# Patient Record
Sex: Male | Born: 1955 | Race: White | Hispanic: No
Health system: Midwestern US, Community
[De-identification: ages and names within clinical notes are randomized; demographics above are authoritative.]

## PROBLEM LIST (undated history)

## (undated) DIAGNOSIS — I10 Essential (primary) hypertension: Secondary | ICD-10-CM

## (undated) DIAGNOSIS — K922 Gastrointestinal hemorrhage, unspecified: Secondary | ICD-10-CM

## (undated) DIAGNOSIS — S069XAA Unspecified intracranial injury with loss of consciousness status unknown, initial encounter: Secondary | ICD-10-CM

## (undated) DIAGNOSIS — F259 Schizoaffective disorder, unspecified: Secondary | ICD-10-CM

## (undated) DIAGNOSIS — I639 Cerebral infarction, unspecified: Secondary | ICD-10-CM

## (undated) DIAGNOSIS — J449 Chronic obstructive pulmonary disease, unspecified: Secondary | ICD-10-CM

## (undated) DIAGNOSIS — B1920 Unspecified viral hepatitis C without hepatic coma: Secondary | ICD-10-CM

## (undated) DIAGNOSIS — I219 Acute myocardial infarction, unspecified: Secondary | ICD-10-CM

## (undated) DIAGNOSIS — I255 Ischemic cardiomyopathy: Secondary | ICD-10-CM

## (undated) DIAGNOSIS — F1910 Other psychoactive substance abuse, uncomplicated: Secondary | ICD-10-CM

## (undated) DIAGNOSIS — F319 Bipolar disorder, unspecified: Secondary | ICD-10-CM

## (undated) DIAGNOSIS — F1010 Alcohol abuse, uncomplicated: Secondary | ICD-10-CM

## (undated) DIAGNOSIS — S069X9A Unspecified intracranial injury with loss of consciousness of unspecified duration, initial encounter: Secondary | ICD-10-CM

## (undated) HISTORY — PX: CRANIOTOMY: SHX93

## (undated) HISTORY — PX: SPLENECTOMY: SUR1306

## (undated) HISTORY — PX: CORONARY ANGIOPLASTY WITH STENT PLACEMENT: SHX49

---

## 2011-08-18 ENCOUNTER — Emergency Department: Payer: Self-pay | Admitting: Emergency Medicine

## 2011-08-18 LAB — COMPREHENSIVE METABOLIC PANEL
Alkaline Phosphatase: 97 U/L (ref 50–136)
Anion Gap: 8 (ref 7–16)
Calcium, Total: 9.1 mg/dL (ref 8.5–10.1)
Chloride: 110 mmol/L — ABNORMAL HIGH (ref 98–107)
Creatinine: 0.97 mg/dL (ref 0.60–1.30)
EGFR (African American): >60
Glucose: 124 mg/dL — ABNORMAL HIGH (ref 65–99)
Potassium: 4 mmol/L (ref 3.5–5.1)
Total Protein: 7.8 g/dL (ref 6.4–8.2)

## 2011-08-18 LAB — CBC
HCT: 39.9 % — ABNORMAL LOW (ref 40.0–52.0)
MCH: 29.7 pg (ref 26.0–34.0)
RDW: 16.1 % — ABNORMAL HIGH (ref 11.5–14.5)
WBC: 14.1 10*3/uL — ABNORMAL HIGH (ref 3.8–10.6)

## 2011-08-18 LAB — ACETAMINOPHEN LEVEL: Acetaminophen: <2 ug/mL

## 2011-08-18 LAB — DRUG SCREEN, URINE
Amphetamines, Ur Screen: NEGATIVE (ref ?–1000)
Barbiturates, Ur Screen: NEGATIVE (ref ?–200)
Benzodiazepine, Ur Scrn: NEGATIVE (ref ?–200)
MDMA (Ecstasy)Ur Screen: NEGATIVE (ref ?–500)
Opiate, Ur Screen: NEGATIVE (ref ?–300)

## 2011-08-18 LAB — ETHANOL
Ethanol %: <0.003 % (ref 0.000–0.080)
Ethanol: <3 mg/dL

## 2011-08-18 LAB — TSH: Thyroid Stimulating Horm: 0.25 u[IU]/mL — ABNORMAL LOW

## 2011-08-19 ENCOUNTER — Emergency Department: Payer: Self-pay | Admitting: Emergency Medicine

## 2012-03-26 ENCOUNTER — Encounter (HOSPITAL_COMMUNITY): Payer: Self-pay | Admitting: *Deleted

## 2012-03-26 ENCOUNTER — Emergency Department (HOSPITAL_COMMUNITY)
Admission: EM | Admit: 2012-03-26 | Discharge: 2012-03-26 | Disposition: A | Discharge status: Home / Self Care | Payer: Medicare Other | Attending: Emergency Medicine | Admitting: Emergency Medicine

## 2012-03-26 DIAGNOSIS — I252 Old myocardial infarction: Secondary | ICD-10-CM | POA: Insufficient documentation

## 2012-03-26 DIAGNOSIS — F17200 Nicotine dependence, unspecified, uncomplicated: Secondary | ICD-10-CM | POA: Insufficient documentation

## 2012-03-26 DIAGNOSIS — M25519 Pain in unspecified shoulder: Secondary | ICD-10-CM | POA: Insufficient documentation

## 2012-03-26 DIAGNOSIS — G8911 Acute pain due to trauma: Secondary | ICD-10-CM | POA: Insufficient documentation

## 2012-03-26 HISTORY — DX: Acute myocardial infarction, unspecified: I21.9

## 2012-03-26 MED ORDER — NAPROXEN 500 MG PO TABS: 500.0000 mg | ORAL_TABLET | Freq: Two times a day (BID) | ORAL | Status: DC

## 2012-03-26 MED ORDER — HYDROCODONE-ACETAMINOPHEN 10-325 MG PO TABS: 1.0000 | ORAL_TABLET | Freq: Four times a day (QID) | ORAL | Status: DC | PRN

## 2012-03-26 MED ORDER — HYDROCODONE-ACETAMINOPHEN 5-325 MG PO TABS
2.0000 | ORAL_TABLET | Freq: Once | ORAL | Status: AC
  Administered 2012-03-26: 2 via ORAL
  Filled 2012-03-26: qty 2

## 2012-03-26 NOTE — ED Notes (Signed)
Pt states he tried to catch a bail of hay 4 weeks ago, injuring left shoulder.

## 2012-03-26 NOTE — ED Provider Notes (Signed)
History  This chart was scribed for Benny Lennert, MD by Erskine Emery, ED Scribe. This patient was seen in room APA09/APA09 and the patient's care was started at 15:09.   CSN: [id number]  Arrival date & time 03/26/12  1449   First MD Initiated Contact with Patient 03/26/12 [phone]      Chief Complaint  Patient presents with  . Shoulder Injury    (Consider location/radiation/quality/duration/timing/severity/associated sxs/prior Treatment) Tyrone [name] is a 57 y.o. male who presents to the Emergency Department complaining of constant left shoulder pain that radiates down the left arm since he dislocated it trying to catch a heavy bail of hay falling off a truck 4 weeks ago. Pt was seen at a hospital in [place] after the incident where they x-rayed the arm and determined that it was dislocated but not fractured. They gave him 1 dose of Loretab, which helped relieve his pain, and told him to set up an appointment with an orthopedic doctor. Pt has an appointment with an orthopedist at the [facility] hospital in [place], but not for another 3 weeks. He has been taking only Tylenol and Motrin which do nothing to relieve his pain.  Patient is a 57 y.o. male presenting with shoulder injury. The history is provided by the patient. No language interpreter was used.  Shoulder Injury This is a new problem. The current episode started more than 1 week ago. The problem occurs constantly. The problem has not changed since onset.Pertinent negatives include no chest pain, no abdominal pain, no headaches and no shortness of breath. Exacerbated by: extension. Nothing relieves the symptoms. He has tried acetaminophen for the symptoms. The treatment provided no relief.    Past Medical History  Diagnosis Date  . Myocardial infarction     2008    Past Surgical History  Procedure Date  . Splenectomy   . Coronary angioplasty with stent placement     No family history on file.  History  Substance Use Topics    . Smoking status: Current Every Day Smoker  . Smokeless tobacco: Not on file  . Alcohol Use: No      Review of Systems  Respiratory: Negative for shortness of breath.   Cardiovascular: Negative for chest pain.  Gastrointestinal: Negative for abdominal pain.  Musculoskeletal:       Left shoulder pain  Neurological: Negative for headaches.  All other systems reviewed and are negative.    Allergies  Nitroglycerin; Toradol; and Ultram  Home Medications  No current outpatient prescriptions on file.  Triage Vitals: BP 146/97  Pulse 104  Temp 98.3 F (36.8 C) (Oral)  Resp 20  Ht 5\' 10"  (1.778 m)  Wt 200 lb (90.719 kg)  BMI 28.70 kg/m2  SpO2 97%  Physical Exam  Nursing note and vitals reviewed. Constitutional: He is oriented to person, place, and time. He appears well-developed.  HENT:  Head: Normocephalic.  Eyes: Conjunctivae normal are normal.  Neck: No tracheal deviation present.  Cardiovascular:  No murmur heard. Musculoskeletal: Normal range of motion. He exhibits tenderness.       Tender to palpation of the left anterior shoulder. Pain with extension.  Neurological: He is oriented to person, place, and time.  Skin: Skin is warm.  Psychiatric: He has a normal mood and affect.    ED Course  Procedures (including critical care time) DIAGNOSTIC STUDIES: Oxygen Saturation is 97% on room air, adequate by my interpretation.    COORDINATION OF CARE: 15:14--I evaluated the patient and we discussed  a treatment plan including pain and antiinflammatory medications to which the pt agreed.    Labs Reviewed - No data to display No results found.   No diagnosis found.    MDM      The chart was scribed for me under my direct supervision.  I personally performed the history, physical, and medical decision making and all procedures in the evaluation of this patient.Benny Lennert, MD 03/26/12 1524

## 2012-03-31 ENCOUNTER — Encounter (HOSPITAL_COMMUNITY): Payer: Self-pay | Admitting: *Deleted

## 2012-03-31 ENCOUNTER — Emergency Department (HOSPITAL_COMMUNITY)
Admission: EM | Admit: 2012-03-31 | Discharge: 2012-03-31 | Disposition: A | Discharge status: Home / Self Care | Payer: Medicare Other | Attending: Emergency Medicine | Admitting: Emergency Medicine

## 2012-03-31 DIAGNOSIS — F17200 Nicotine dependence, unspecified, uncomplicated: Secondary | ICD-10-CM | POA: Insufficient documentation

## 2012-03-31 DIAGNOSIS — M25519 Pain in unspecified shoulder: Secondary | ICD-10-CM | POA: Insufficient documentation

## 2012-03-31 DIAGNOSIS — I252 Old myocardial infarction: Secondary | ICD-10-CM | POA: Insufficient documentation

## 2012-03-31 DIAGNOSIS — M25512 Pain in left shoulder: Secondary | ICD-10-CM

## 2012-03-31 MED ORDER — OXYCODONE-ACETAMINOPHEN 5-325 MG PO TABS
2.0000 | ORAL_TABLET | Freq: Once | ORAL | Status: AC
  Administered 2012-03-31: 2 via ORAL
  Filled 2012-03-31: qty 2

## 2012-03-31 MED ORDER — NAPROXEN 500 MG PO TABS: 500.0000 mg | ORAL_TABLET | Freq: Two times a day (BID) | ORAL | Status: DC

## 2012-03-31 NOTE — ED Notes (Signed)
Pt states left shoulder injury x 5 weeks ago. Seen here previously for the same. States he recently moved and is unable to find pain med from last visit.

## 2012-03-31 NOTE — ED Provider Notes (Signed)
History     CSN: [id number]  Arrival date & time 03/31/12  1025   First MD Initiated Contact with Patient 03/31/12 1044      Chief Complaint  Patient presents with  . Shoulder Pain    (Consider location/radiation/quality/duration/timing/severity/associated sxs/prior treatment) HPI Comments: Patient with hx of chronic pain to the left shoulder for more than one month, states he has "torn the muscles and rotator cuff" to his left shoulder several weeks ago when trying to catch a falling log.  States he had X rays performed at [facility] at the time of the accident.  States that he has appt in 3 weeks with an orthopedist at the [place] in [place].  He was sen here earlier this week and given a prescription for #60 10 mg- Hydrocodone tablets which he states he lost during his recent move from [place] to [place].  He denies chest pain, shortness of breath, neck pain, numbness or weakness of the upper extremities  Patient is a 57 y.o. male presenting with shoulder pain. The history is provided by the patient.  Shoulder Pain This is a chronic problem. The current episode started more than 1 month ago. The problem occurs constantly. The problem has been unchanged. Associated symptoms include arthralgias. Pertinent negatives include no chest pain, chills, congestion, coughing, diaphoresis, fever, headaches, joint swelling, neck pain, numbness, rash, vertigo, vomiting or weakness. The symptoms are aggravated by bending (movement and palpation). He has tried NSAIDs, heat, ice, oral narcotics and position changes for the symptoms. The treatment provided mild relief.    Past Medical History  Diagnosis Date  . Myocardial infarction     2008    Past Surgical History  Procedure Date  . Splenectomy   . Coronary angioplasty with stent placement     No family history on file.  History  Substance Use Topics  . Smoking status: Current Every Day Smoker  . Smokeless tobacco: Not on file  .  Alcohol Use: No      Review of Systems  Constitutional: Negative for fever, chills and diaphoresis.  HENT: Negative for congestion and neck pain.   Respiratory: Negative for cough and chest tightness.   Cardiovascular: Negative for chest pain.  Gastrointestinal: Negative for vomiting.  Genitourinary: Negative for dysuria, flank pain and difficulty urinating.  Musculoskeletal: Positive for arthralgias. Negative for back pain and joint swelling.  Skin: Negative for color change, rash and wound.  Neurological: Negative for dizziness, vertigo, weakness, numbness and headaches.  All other systems reviewed and are negative.    Allergies  Nitroglycerin; Toradol; and Ultram  Home Medications   Current Outpatient Rx  Name  Route  Sig  Dispense  Refill  . HYDROCODONE-ACETAMINOPHEN 10-325 MG PO TABS   Oral   Take 1 tablet by mouth every 6 (six) hours as needed for pain.   60 tablet   0   . NAPROXEN 500 MG PO TABS   Oral   Take 1 tablet (500 mg total) by mouth 2 (two) times daily.   60 tablet   0     BP 195/94  Pulse 73  Temp 97.8 F (36.6 C) (Oral)  Resp 16  SpO2 99%  Physical Exam  Nursing note and vitals reviewed. Constitutional: He is oriented to person, place, and time. He appears well-developed and well-nourished. No distress.  HENT:  Head: Normocephalic and atraumatic.  Neck: Normal range of motion, full passive range of motion without pain and phonation normal. Neck supple. No spinous  process tenderness and no muscular tenderness present. No Brudzinski's sign and no Kernig's sign noted.  Cardiovascular: Normal rate, normal heart sounds and intact distal pulses.   No murmur heard. Pulmonary/Chest: Effort normal and breath sounds normal. No respiratory distress.  Musculoskeletal: He exhibits tenderness. He exhibits no edema.       Left shoulder: He exhibits tenderness and pain. He exhibits normal range of motion, no bony tenderness, no swelling, no effusion, no  crepitus, no deformity, no laceration, no spasm, normal pulse and normal strength.       Arms:      Diffuse ttp of the left anterior shoulder and scapula.  Pain to the shoulder is reproduced with abduction of the left arm.  No bony deformities, edema or discoloration.  Distal sensation intact, CR< 2 sec, radial pulse is brisk.    Lymphadenopathy:    He has no cervical adenopathy.  Neurological: He is alert and oriented to person, place, and time. He exhibits normal muscle tone. Coordination normal.  Skin: Skin is warm and dry.    ED Course  Procedures (including critical care time)  Labs Reviewed - No data to display No results found.     MDM    Previous ED chart reviewed.  Patient also reviewed on  narcotic database.  Had rx for #60 hydrocodone 10/325 mg filled on Monday 03/26/12.  I was also advised by nursing staff that patient also frequents [facility] hospital requesting narcotics for shoulder pain.   I have advised pt that problem is chronic and he will need further management with his orthopedist at [facility] in [place], [place].  No Focal neuro deficits on exam. Pain reproduced with abduction of the left arm.  No step-offs or bony deformities noted on exam.   Prescribed: naprosyn   Rorey Bisson L. [place], [place] 03/31/12 1713

## 2012-03-31 NOTE — ED Notes (Signed)
Pt upset and complaining about receiving naprosyn prescription instead of "percocet or vicoden or norco". Pt refuses to sign d/c. Paperwork and prescription left in room.

## 2012-04-01 NOTE — ED Provider Notes (Signed)
Medical screening examination/treatment/procedure(s) were performed by non-physician practitioner and as supervising physician I was immediately available for consultation/collaboration.   Joya Gaskins, MD 04/01/12 [phone]

## 2013-04-25 ENCOUNTER — Encounter (HOSPITAL_COMMUNITY): Payer: Self-pay

## 2013-04-25 ENCOUNTER — Inpatient Hospital Stay (HOSPITAL_COMMUNITY)
Admission: EM | Admit: 2013-04-25 | Discharge: 2013-05-02 | Disposition: A | Discharge status: Home / Self Care | Payer: Medicare Other | Source: Home / Self Care | Attending: Critical Care Medicine | Admitting: Critical Care Medicine

## 2013-04-25 ENCOUNTER — Inpatient Hospital Stay (HOSPITAL_COMMUNITY)
Admission: AD | Admit: 2013-04-25 | Discharge: 2013-04-25 | DRG: 356 | Discharge status: Against Medical Advice | Payer: Medicare Other | Source: Other Acute Inpatient Hospital | Attending: Internal Medicine | Admitting: Internal Medicine

## 2013-04-25 DIAGNOSIS — K3182 Dieulafoy lesion (hemorrhagic) of stomach and duodenum: Principal | ICD-10-CM | POA: Diagnosis present

## 2013-04-25 DIAGNOSIS — S069XAA Unspecified intracranial injury with loss of consciousness status unknown, initial encounter: Secondary | ICD-10-CM

## 2013-04-25 DIAGNOSIS — I25810 Atherosclerosis of coronary artery bypass graft(s) without angina pectoris: Secondary | ICD-10-CM

## 2013-04-25 DIAGNOSIS — I2510 Atherosclerotic heart disease of native coronary artery without angina pectoris: Secondary | ICD-10-CM | POA: Diagnosis present

## 2013-04-25 DIAGNOSIS — D62 Acute posthemorrhagic anemia: Secondary | ICD-10-CM | POA: Diagnosis not present

## 2013-04-25 DIAGNOSIS — K922 Gastrointestinal hemorrhage, unspecified: Secondary | ICD-10-CM | POA: Diagnosis present

## 2013-04-25 DIAGNOSIS — F17200 Nicotine dependence, unspecified, uncomplicated: Secondary | ICD-10-CM | POA: Diagnosis present

## 2013-04-25 DIAGNOSIS — S069X9A Unspecified intracranial injury with loss of consciousness of unspecified duration, initial encounter: Secondary | ICD-10-CM

## 2013-04-25 DIAGNOSIS — D500 Iron deficiency anemia secondary to blood loss (chronic): Secondary | ICD-10-CM | POA: Diagnosis present

## 2013-04-25 DIAGNOSIS — E876 Hypokalemia: Secondary | ICD-10-CM | POA: Diagnosis not present

## 2013-04-25 DIAGNOSIS — J9600 Acute respiratory failure, unspecified whether with hypoxia or hypercapnia: Secondary | ICD-10-CM | POA: Diagnosis not present

## 2013-04-25 DIAGNOSIS — F1920 Other psychoactive substance dependence, uncomplicated: Secondary | ICD-10-CM | POA: Diagnosis present

## 2013-04-25 DIAGNOSIS — I252 Old myocardial infarction: Secondary | ICD-10-CM

## 2013-04-25 DIAGNOSIS — F319 Bipolar disorder, unspecified: Secondary | ICD-10-CM

## 2013-04-25 DIAGNOSIS — G9340 Encephalopathy, unspecified: Secondary | ICD-10-CM | POA: Diagnosis not present

## 2013-04-25 DIAGNOSIS — R578 Other shock: Secondary | ICD-10-CM | POA: Diagnosis present

## 2013-04-25 DIAGNOSIS — Z9089 Acquired absence of other organs: Secondary | ICD-10-CM

## 2013-04-25 DIAGNOSIS — Z9861 Coronary angioplasty status: Secondary | ICD-10-CM

## 2013-04-25 DIAGNOSIS — N179 Acute kidney failure, unspecified: Secondary | ICD-10-CM | POA: Diagnosis not present

## 2013-04-25 DIAGNOSIS — F19939 Other psychoactive substance use, unspecified with withdrawal, unspecified: Secondary | ICD-10-CM | POA: Diagnosis not present

## 2013-04-25 DIAGNOSIS — F1010 Alcohol abuse, uncomplicated: Secondary | ICD-10-CM | POA: Diagnosis present

## 2013-04-25 DIAGNOSIS — E875 Hyperkalemia: Secondary | ICD-10-CM | POA: Diagnosis not present

## 2013-04-25 DIAGNOSIS — F411 Generalized anxiety disorder: Secondary | ICD-10-CM

## 2013-04-25 DIAGNOSIS — J449 Chronic obstructive pulmonary disease, unspecified: Secondary | ICD-10-CM | POA: Diagnosis present

## 2013-04-25 DIAGNOSIS — J441 Chronic obstructive pulmonary disease with (acute) exacerbation: Secondary | ICD-10-CM

## 2013-04-25 DIAGNOSIS — J4489 Other specified chronic obstructive pulmonary disease: Secondary | ICD-10-CM | POA: Diagnosis present

## 2013-04-25 LAB — COMPREHENSIVE METABOLIC PANEL
ALBUMIN: 2.6 g/dL — AB (ref 3.5–5.2)
ALK PHOS: 54 U/L (ref 39–117)
ALT: 21 U/L (ref 0–53)
AST: 27 U/L (ref 0–37)
BILIRUBIN TOTAL: 0.4 mg/dL (ref 0.3–1.2)
BUN: 38 mg/dL — ABNORMAL HIGH (ref 6–23)
CHLORIDE: 108 meq/L (ref 96–112)
CO2: 22 mEq/L (ref 19–32)
Calcium: 8.5 mg/dL (ref 8.4–10.5)
Creatinine, Ser: 0.69 mg/dL (ref 0.50–1.35)
GFR calc Af Amer: >90 mL/min (ref >90)
GFR calc non Af Amer: >90 mL/min (ref >90)
Glucose, Bld: 109 mg/dL — ABNORMAL HIGH (ref 70–99)
Potassium: 5 mEq/L (ref 3.7–5.3)
Sodium: 140 mEq/L (ref 137–147)
Total Protein: 5.1 g/dL — ABNORMAL LOW (ref 6.0–8.3)

## 2013-04-25 LAB — CBC
HCT: 26.4 % — ABNORMAL LOW (ref 39.0–52.0)
HCT: 22.2 % — ABNORMAL LOW (ref 39.0–52.0)
HEMATOCRIT: 25.7 % — AB (ref 39.0–52.0)
HEMATOCRIT: 22.6 % — AB (ref 39.0–52.0)
HEMOGLOBIN: 9 g/dL — AB (ref 13.0–17.0)
HEMOGLOBIN: 9.1 g/dL — AB (ref 13.0–17.0)
HEMOGLOBIN: 7.8 g/dL — AB (ref 13.0–17.0)
HEMOGLOBIN: 7.9 g/dL — AB (ref 13.0–17.0)
MCH: 33.3 pg (ref 26.0–34.0)
MCH: 32.6 pg (ref 26.0–34.0)
MCH: 33.1 pg (ref 26.0–34.0)
MCH: 33.9 pg (ref 26.0–34.0)
MCHC: 35 g/dL (ref 30.0–36.0)
MCHC: 34.5 g/dL (ref 30.0–36.0)
MCHC: 34.5 g/dL (ref 30.0–36.0)
MCHC: 35.6 g/dL (ref 30.0–36.0)
MCV: 95.2 fL (ref 78.0–100.0)
MCV: 94.6 fL (ref 78.0–100.0)
MCV: 95.3 fL (ref 78.0–100.0)
MCV: 95.8 fL (ref 78.0–100.0)
Platelets: 140 10*3/uL — ABNORMAL LOW (ref 150–400)
Platelets: 146 10*3/uL — ABNORMAL LOW (ref 150–400)
Platelets: 140 10*3/uL — ABNORMAL LOW (ref 150–400)
Platelets: 147 10*3/uL — ABNORMAL LOW (ref 150–400)
RBC: 2.7 MIL/uL — ABNORMAL LOW (ref 4.22–5.81)
RBC: 2.79 MIL/uL — ABNORMAL LOW (ref 4.22–5.81)
RBC: 2.33 MIL/uL — ABNORMAL LOW (ref 4.22–5.81)
RBC: 2.36 MIL/uL — AB (ref 4.22–5.81)
RDW: 14.2 % (ref 11.5–15.5)
RDW: 14.2 % (ref 11.5–15.5)
RDW: 14.4 % (ref 11.5–15.5)
RDW: 14.7 % (ref 11.5–15.5)
WBC: 12.3 10*3/uL — AB (ref 4.0–10.5)
WBC: 12.7 10*3/uL — AB (ref 4.0–10.5)
WBC: 17.8 10*3/uL — ABNORMAL HIGH (ref 4.0–10.5)
WBC: 18.2 10*3/uL — AB (ref 4.0–10.5)

## 2013-04-25 LAB — URINALYSIS, ROUTINE W REFLEX MICROSCOPIC
Bilirubin Urine: NEGATIVE
GLUCOSE, UA: NEGATIVE mg/dL
Hgb urine dipstick: NEGATIVE
KETONES UR: 15 mg/dL — AB
LEUKOCYTES UA: NEGATIVE
Nitrite: NEGATIVE
Protein, ur: NEGATIVE mg/dL
Specific Gravity, Urine: 1.023 (ref 1.005–1.030)
UROBILINOGEN UA: 1 mg/dL (ref 0.0–1.0)
pH: 7 (ref 5.0–8.0)

## 2013-04-25 LAB — PREPARE RBC (CROSSMATCH)

## 2013-04-25 LAB — MAGNESIUM: Magnesium: 1.9 mg/dL (ref 1.5–2.5)

## 2013-04-25 LAB — POCT I-STAT, CHEM 8
BUN: 39 mg/dL — AB (ref 6–23)
CREATININE: 1 mg/dL (ref 0.50–1.35)
Calcium, Ion: 1.28 mmol/L — ABNORMAL HIGH (ref 1.12–1.23)
Chloride: 106 mEq/L (ref 96–112)
Glucose, Bld: 147 mg/dL — ABNORMAL HIGH (ref 70–99)
HCT: 23 % — ABNORMAL LOW (ref 39.0–52.0)
Hemoglobin: 7.8 g/dL — ABNORMAL LOW (ref 13.0–17.0)
Potassium: 3.6 mEq/L — ABNORMAL LOW (ref 3.7–5.3)
SODIUM: 142 meq/L (ref 137–147)
TCO2: 20 mmol/L (ref 0–100)

## 2013-04-25 LAB — APTT
APTT: 34 s (ref 24–37)
aPTT: 34 seconds (ref 24–37)
aPTT: 30 seconds (ref 24–37)

## 2013-04-25 LAB — PROTIME-INR
INR: 1.13 (ref 0.00–1.49)
INR: 1.15 (ref 0.00–1.49)
INR: 1.09 (ref 0.00–1.49)
PROTHROMBIN TIME: 14.3 s (ref 11.6–15.2)
PROTHROMBIN TIME: 13.9 s (ref 11.6–15.2)
Prothrombin Time: 14.5 seconds (ref 11.6–15.2)

## 2013-04-25 LAB — MRSA PCR SCREENING: MRSA BY PCR: NEGATIVE

## 2013-04-25 LAB — PHOSPHORUS: Phosphorus: 2.4 mg/dL (ref 2.3–4.6)

## 2013-04-25 LAB — PROCALCITONIN

## 2013-04-25 LAB — CORTISOL: Cortisol, Plasma: 14.7 ug/dL

## 2013-04-25 LAB — TROPONIN I: Troponin I: <0.3 ng/mL (ref <0.30)

## 2013-04-25 LAB — ABO/RH: ABO/RH(D): O POS

## 2013-04-25 LAB — LACTIC ACID, PLASMA: Lactic Acid, Venous: 1.1 mmol/L (ref 0.5–2.2)

## 2013-04-25 MED ORDER — ONDANSETRON HCL 4 MG/2ML IJ SOLN
4.0000 mg | Freq: Four times a day (QID) | INTRAMUSCULAR | Status: DC | PRN
  Administered 2013-04-25 – 2013-04-28 (×2): 4 mg via INTRAVENOUS
  Filled 2013-04-25 (×2): qty 2

## 2013-04-25 MED ORDER — SODIUM CHLORIDE 0.9 % IV SOLN
8.0000 mg/h | INTRAVENOUS | Status: DC
  Administered 2013-04-25 – 2013-04-27 (×4): 8 mg/h via INTRAVENOUS
  Filled 2013-04-25 (×10): qty 80

## 2013-04-25 MED ORDER — SODIUM CHLORIDE 0.9 % IV SOLN
25.0000 ug/h | INTRAVENOUS | Status: DC
  Administered 2013-04-25: 30 ug/h via INTRAVENOUS
  Administered 2013-04-26: 25 ug/h via INTRAVENOUS
  Filled 2013-04-25 (×8): qty 1

## 2013-04-25 MED ORDER — FENTANYL CITRATE 0.05 MG/ML IJ SOLN: 25.0000 ug | INTRAMUSCULAR | Status: DC | PRN

## 2013-04-25 MED ORDER — SODIUM CHLORIDE 0.9 % IV SOLN
8.0000 mg/h | INTRAVENOUS | Status: DC
  Administered 2013-04-25: 8 mg/h via INTRAVENOUS
  Filled 2013-04-25 (×3): qty 80

## 2013-04-25 MED ORDER — SODIUM CHLORIDE 0.9 % IV SOLN
25.0000 ug/h | INTRAVENOUS | Status: DC
  Administered 2013-04-25: 25 ug/h via INTRAVENOUS
  Filled 2013-04-25 (×3): qty 1

## 2013-04-25 MED ORDER — SODIUM CHLORIDE 0.9 % IV SOLN: INTRAVENOUS | Status: DC

## 2013-04-25 MED ORDER — ONDANSETRON HCL 4 MG/2ML IJ SOLN
INTRAMUSCULAR | Status: AC
Start: 2013-04-25 — End: 2013-04-25
  Administered 2013-04-25: 4 mg via INTRAVENOUS
  Filled 2013-04-25: qty 2

## 2013-04-25 MED ORDER — OCTREOTIDE LOAD VIA INFUSION
50.0000 ug | Freq: Once | INTRAVENOUS | Status: AC
  Filled 2013-04-25: qty 25

## 2013-04-25 MED ORDER — SODIUM CHLORIDE 0.9 % IV SOLN
80.0000 mg | Freq: Once | INTRAVENOUS | Status: AC
  Administered 2013-04-25: 80 mg via INTRAVENOUS
  Filled 2013-04-25: qty 80

## 2013-04-25 MED ORDER — PANTOPRAZOLE SODIUM 40 MG IV SOLR
40.0000 mg | Freq: Two times a day (BID) | INTRAVENOUS | Status: DC
Start: 2013-04-28 — End: 2013-04-25

## 2013-04-25 MED ORDER — SODIUM CHLORIDE 0.9 % IV SOLN
80.0000 mg | Freq: Once | INTRAVENOUS | Status: DC
  Filled 2013-04-25: qty 80

## 2013-04-25 MED ORDER — FENTANYL CITRATE 0.05 MG/ML IJ SOLN
INTRAMUSCULAR | Status: AC
Start: 2013-04-25 — End: 2013-04-26
  Filled 2013-04-25: qty 2

## 2013-04-25 MED ORDER — SODIUM CHLORIDE 0.9 % IV SOLN
INTRAVENOUS | Status: DC
  Administered 2013-04-25 – 2013-04-26 (×3): via INTRAVENOUS

## 2013-04-25 MED ORDER — NICOTINE 21 MG/24HR TD PT24
21.0000 mg | MEDICATED_PATCH | Freq: Every day | TRANSDERMAL | Status: DC
  Administered 2013-04-25: 21 mg via TRANSDERMAL
  Filled 2013-04-25: qty 1

## 2013-04-25 MED ORDER — FENTANYL CITRATE 0.05 MG/ML IJ SOLN
50.0000 ug | Freq: Once | INTRAMUSCULAR | Status: AC
Start: 2013-04-25 — End: 2013-04-25
  Administered 2013-04-25: 50 ug via INTRAVENOUS

## 2013-04-25 MED ORDER — METOCLOPRAMIDE HCL 5 MG/ML IJ SOLN
5.0000 mg | Freq: Three times a day (TID) | INTRAMUSCULAR | Status: DC | PRN
  Administered 2013-04-25: 5 mg via INTRAVENOUS
  Filled 2013-04-25: qty 1
  Filled 2013-04-25: qty 2

## 2013-04-25 MED ORDER — LORAZEPAM 2 MG/ML IJ SOLN
0.2500 mg | INTRAMUSCULAR | Status: DC | PRN
  Administered 2013-04-25: 0.5 mg via INTRAVENOUS
  Filled 2013-04-25: qty 1

## 2013-04-25 MED ORDER — FENTANYL CITRATE 0.05 MG/ML IJ SOLN
25.0000 ug | INTRAMUSCULAR | Status: DC | PRN
  Administered 2013-04-25 – 2013-04-26 (×2): 50 ug via INTRAVENOUS
  Filled 2013-04-25 (×2): qty 2

## 2013-04-25 MED ORDER — ONDANSETRON HCL 4 MG/2ML IJ SOLN
4.0000 mg | Freq: Once | INTRAMUSCULAR | Status: AC
  Administered 2013-04-25: 4 mg via INTRAVENOUS

## 2013-04-25 NOTE — H&P (Signed)
Name: Tyrone [name] MRN: [id number] DOB: 06-Aug-1955    ADMISSION DATE:  04/25/2013    PRIMARY SERVICE: PCCM   CHIEF COMPLAINT:  GIB  BRIEF PATIENT DESCRIPTION:  58 yo WM transferred from [facility] on 2/12 for UGIB , transient hypotension. He was seen 6 days prior at [facility]. for similar issue but signed out AMA. He was admitted to [facility] service, placed on PPI drip, Octreotide Drip and GI called. Per pt he had: EGD was told (reliability?) for gastric avm? Later in the afternoon on 2/12 he became more anxious and agitated. He left AMA at 1821. He returned within an hour to the ER having passed large volume dk bloody stool and vomiting large volume of blood. PCCM re-admitted.   SIGNIFICANT EVENTS / STUDIES:  2/12: left ama, re-admitted a hour after.   LINES / TUBES: PIV  CULTURES: none  ANTIBIOTICS: none  HISTORY OF PRESENT ILLNESS:   58 yo WM transferred from [facility] on 2/12 for UGIB , transient hypotension. He was seen 6 days prior at [facility]. for similar issue but signed out AMA. He presented to [facility] with chief complaint of right calf pain and had negative LEDS and was being discharged when he vomited blood and passed out. [place] [facility] were on diversion therefore he was transferred to [place], He was placed on PPI drip, Octreotide Drip and GI called. 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 EGD was told (reliability?) for gastric avm? Later in the afternoon on 2/12 he became more anxious and agitated. He left AMA at 1821. He returned within an hour to the ER having passed large volume dk bloody stool and vomiting large volume of blood. PCCM re-admitted.  PAST MEDICAL HISTORY :  Past Medical History  Diagnosis Date  . Myocardial infarction     2008   Past Surgical History  Procedure Laterality Date  . Splenectomy    . Coronary angioplasty with stent placement     Prior to Admission medications   Medication Sig Start Date End Date  Taking? Authorizing Provider  ALPRAZolam Prudy Feeler) 1 MG tablet Take 1 mg by mouth 3 (three) times daily.    Yes Historical Provider, MD  HYDROcodone-acetaminophen (NORCO) 10-325 MG per tablet Take 1-2 tablets by mouth every 6 (six) hours as needed for moderate pain.   Yes Historical Provider, MD   Allergies  Allergen Reactions  . Nitroglycerin Nausea And Vomiting  . Toradol [Ketorolac Tromethamine] Itching  . Ultram [Tramadol] Itching    FAMILY HISTORY:  No family history on file. SOCIAL HISTORY:  reports that he has been smoking Cigarettes.  He has a 40 pack-year smoking history. He does not have any smokeless tobacco history on file. He reports that he does not drink alcohol or use illicit drugs.  REVIEW OF SYSTEMS:   Review of Systems:   Bolds are positive  Constitutional: weight loss, gain, night sweats, Fevers, chills, fatigue .  HEENT: headaches, Sore throat, sneezing, nasal congestion, post nasal drip, Difficulty swallowing, Tooth/dental problems, visual complaints visual changes, ear ache CV:  chest pain, radiates: ,Orthopnea, PND, swelling in lower extremities, dizziness, palpitations, syncope.  GI  heartburn, indigestion, abdominal pain, nausea, vomiting, diarrhea, change in bowel habits, loss of appetite, bloody stools.  Resp: cough, productive: , hemoptysis, dyspnea, chest pain, pleuritic.  Skin: rash or itching or icterus GU: dysuria, change in color of urine, urgency or frequency. flank pain, hematuria  MS: joint pain or swelling. decreased range of  motion  Psych: change in mood or affect. depression or anxiety.  Neuro: difficulty with speech, weakness, numbness, ataxia   SUBJECTIVE:  Feels better  VITAL SIGNS: Temp:  [97.9 F (36.6 C)-99.4 F (37.4 C)] 98.3 F (36.8 C) (02/12 2212) Pulse Rate:  [76-108] 87 (02/12 2215) Resp:  [12-30] 18 (02/12 2215) BP: (77-141)/(45-108) 101/71 mmHg (02/12 2215) SpO2:  [92 %-100 %] 100 % (02/12 2215) Weight:  [68.04 kg (150 lb)]  68.04 kg (150 lb) (02/12 0853) HEMODYNAMICS: BP 70 initially on admit, now >100      INTAKE / OUTPUT: Intake/Output     02/12 0701 - 02/13 0700   I.V. 2000   Blood 301.4   Total Intake 2301.4   Net +2301.4       Emesis Occurrence 1 x     PHYSICAL EXAMINATION: General:  Not in acute distress.  Neuro:  [place], no focal def  HEENT:  [place], edentulous  Cardiovascular:  rrr Lungs:  Clear  Abdomen:  Tender to palp  Musculoskeletal:  Intact  Skin:  Dry, intact   LABS:  CBC  Recent Labs Lab 04/25/13 1040 04/25/13 1306 04/25/13 2050 04/25/13 2105  WBC 12.3* 12.7* 17.8*  --   HGB 9.0* 9.1* 7.9* 7.8*  HCT 25.7* 26.4* 22.2* 23.0*  PLT 140* 146* 140*  --    Coag's  Recent Labs Lab 04/25/13 1040 04/25/13 1242  APTT 34 34  INR 1.13 1.15   BMET  Recent Labs Lab 04/25/13 1040 04/25/13 2105  NA 140 142  K 5.0 3.6*  CL 108 106  CO2 22  --   BUN 38* 39*  CREATININE 0.69 1.00  GLUCOSE 109* 147*   Electrolytes  Recent Labs Lab 04/25/13 1040  CALCIUM 8.5  MG 1.9  PHOS 2.4   Sepsis Markers  Recent Labs Lab 04/25/13 1040  LATICACIDVEN 1.1  PROCALCITON <0.10   ABG No results found for this basename: PHART, PCO2ART, PO2ART,  in the last 168 hours Liver Enzymes  Recent Labs Lab 04/25/13 1040  AST 27  ALT 21  ALKPHOS 54  BILITOT 0.4  ALBUMIN 2.6*   Cardiac Enzymes  Recent Labs Lab 04/25/13 1040  TROPONINI <0.30   Glucose No results found for this basename: GLUCAP,  in the last 168 hours  Imaging No results found.   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 . Think this prob was reason he left AMA  P:  Takes xanax at home, regularly..will give him low dose lorazepam  TODAY'S SUMMARY:  Acute UGIB. Report of gastric AVM. Left AMA but back w/in an hour after vomiting  bright red blood, and having lg volume dk stool. Hgb dropped from 9.1-->7.9. Getting blood. Have started PPI gtt and octreotide. GI consult requested  I have personally obtained a history, examined the patient, evaluated laboratory and imaging results, formulated the assessment and plan and placed orders. CRITICAL CARE: The patient is critically ill with multiple organ systems failure and requires high complexity decision making for assessment and support, frequent evaluation and titration of therapies, application of advanced monitoring technologies and extensive interpretation of multiple databases. Critical Care Time devoted to patient care services described in this note is 60 minutes.   Dorcas Carrow Pulmonary and Critical Care Medicine [facility] Pager: [phone]  04/25/2013, 10:17 PM

## 2013-04-25 NOTE — Consult Note (Signed)
Subjective:   HPI  58 year old male who was admitted this morning to the hospital ICU because of GI bleeding. He had been transferred here from outside hospital. He left the ICU AMA and came  to the ED to have his meds filled. While in the waiting room he vomited blood and became hypotensive. He was given IV fluids and two units of blood in the ED. He states that he had an EGD about 4 days ago at an outside hospital because of GI bleeding and in talking to him I am not sure what they found and we have no records. He mentioned maybe an ulcer but critical care mentions possible AVM. Patient denies drinking alcohol and denies liver disease. He states that he  Had a heart attack one week ago and had a cath. He has stents in heart vessels. He was on Plavix but says he has not taken any in a few weeks. He was also on aspirin but hasn't taken any in a few days.   Review of Systems No chest pain or dyspnea at this time  Past Medical History  Diagnosis Date  . Myocardial infarction     2008   Past Surgical History  Procedure Laterality Date  . Splenectomy    . Coronary angioplasty with stent placement     History   Social History  . Marital Status: Married    Spouse Name: N/A    Number of Children: N/A  . Years of Education: N/A   Occupational History  . Not on file.   Social History Main Topics  . Smoking status: Current Every Day Smoker -- 1.00 packs/day for 40 years    Types: Cigarettes  . Smokeless tobacco: Not on file  . Alcohol Use: No  . Drug Use: No  . Sexual Activity: Yes   Other Topics Concern  . Not on file   Social History Narrative  . No narrative on file   family history is not on file. Current facility-administered medications:0.9 %  sodium chloride infusion, , Intravenous, Continuous, Simonne Martinet, NP, Last Rate: 100 mL/hr at 04/25/13 2211;  fentaNYL (SUBLIMAZE) injection 25-50 mcg, 25-50 mcg, Intravenous, Q1H PRN, Simonne Martinet, NP, 50 mcg at 04/25/13 2243;   LORazepam (ATIVAN) injection 0.25-0.5 mg, 0.25-0.5 mg, Intravenous, Q4H PRN, Simonne Martinet, NP, 0.5 mg at 04/25/13 2227 metoCLOPramide (REGLAN) injection 5 mg, 5 mg, Intravenous, Q8H PRN, Simonne Martinet, NP, 5 mg at 04/25/13 2244;  octreotide (SANDOSTATIN) 2 mcg/mL in sodium chloride 0.9 % 250 mL infusion, 25-50 mcg/hr, Intravenous, Continuous, Nathan R. Pickering, MD, Last Rate: 12.5 mL/hr at 04/25/13 2216, 25 mcg/hr at 04/25/13 2216;  ondansetron [facility]) injection 4 mg, 4 mg, Intravenous, Q6H PRN, Simonne Martinet, NP, 4 mg at 04/25/13 2243 pantoprazole (PROTONIX) 80 mg in sodium chloride 0.9 % 250 mL infusion, 8 mg/hr, Intravenous, Continuous, Nathan R. Pickering, MD, Last Rate: 25 mL/hr at 04/25/13 2156, 8 mg/hr at 04/25/13 2156 Allergies  Allergen Reactions  . Nitroglycerin Nausea And Vomiting  . Toradol [Ketorolac Tromethamine] Itching  . Ultram [Tramadol] Itching     Objective:     BP 101/69  Pulse 101  Temp(Src) 98.3 F (36.8 C) (Oral)  Resp 18  SpO2 99%  Alert and he does not appear in acute distress at this time Non icteric Heart with regular rhythm no murmurs Lungs clear Abdomen bowel sound normal, soft and non tender  Laboratory No components found with this basename: d1  Assessment:     Upper GI bleed      Plan:     He is being admitted to the ICU. He has received 2 units of PRCs. He has been started on a Protonix drip and also on Octreotide. His H and H s will be followed. Transfuse blood as needed. I discussed EGD with him to further evaluate. He is agreeable. We will plan to do this in the morning, unless the situation changes where it needs to be done more emergently.  Lab Results  Component Value Date   HGB 7.8* 04/25/2013   HGB 7.9* 04/25/2013   HGB 7.8* 04/25/2013   HGB 9.1* 04/25/2013   HCT 23.0* 04/25/2013   HCT 22.2* 04/25/2013   HCT 22.6* 04/25/2013   HCT 26.4* 04/25/2013   ALKPHOS 54 04/25/2013   AST 27 04/25/2013   ALT 21 04/25/2013

## 2013-04-25 NOTE — ED Notes (Signed)
Pt reports being transferred today from [place] to [facility] 2H, pt left ama bc "he didn't like being hooked up to everything." pt back today due needing prescriptions. Pt had been admitted due to multiple stents placed and vomiting blood.

## 2013-04-25 NOTE — ED Provider Notes (Signed)
CSN: [id number]     Arrival date & time 04/25/13  1839 History   First MD Initiated Contact with Patient 04/25/13 2050     Chief Complaint  Patient presents with  . Medication Refill   Level V caveat due to urgent need for intervention.  (Consider location/radiation/quality/duration/timing/severity/associated sxs/prior Treatment) The history is provided by the patient.   patient left AMA from the ICU earlier today. He was admitted for GI bleeding. He came down to the ED to get his medications refilled. He states he does not like that he had tubes on him up there and that he was attached to tubes. He states his been vomiting blood and had blood in his stool. He vomited red blood in the ED the waiting room and has reportedly also had bloody stool. He became hypotensive and diaphoretic.  Past Medical History  Diagnosis Date  . Myocardial infarction     2008   Past Surgical History  Procedure Laterality Date  . Splenectomy    . Coronary angioplasty with stent placement     No family history on file. History  Substance Use Topics  . Smoking status: Current Every Day Smoker -- 1.00 packs/day for 40 years    Types: Cigarettes  . Smokeless tobacco: Not on file  . Alcohol Use: No    Review of Systems  Unable to perform ROS     Allergies  Nitroglycerin; Toradol; and Ultram  Home Medications   No current outpatient prescriptions on file. BP 101/69  Pulse 101  Temp(Src) 98.3 F (36.8 C) (Oral)  Resp 18  SpO2 99% Physical Exam  Constitutional: He appears well-developed and well-nourished.  HENT:  Head: Normocephalic.  Bright red blood around mouth  Eyes: Pupils are equal, round, and reactive to light.  Neck: Neck supple.  Cardiovascular:  Tachycardia  Pulmonary/Chest: No respiratory distress.  Mild tachypnea  Musculoskeletal: Normal range of motion.  Neurological: He is alert.  Patient is somewhat sedate. Arouses to stimulation  Skin: Skin is warm. There is pallor.     ED Course  Procedures (including critical care time) Labs Review Labs Reviewed  CBC - Abnormal; Notable for the following:    WBC 17.8 (*)    RBC 2.33 (*)    Hemoglobin 7.9 (*)    HCT 22.2 (*)    Platelets 140 (*)    All other components within normal limits  CBC - Abnormal; Notable for the following:    WBC 18.2 (*)    RBC 2.36 (*)    Hemoglobin 7.8 (*)    HCT 22.6 (*)    Platelets 147 (*)    All other components within normal limits  POCT I-STAT, CHEM 8 - Abnormal; Notable for the following:    Potassium 3.6 (*)    BUN 39 (*)    Glucose, Bld 147 (*)    Calcium, Ion 1.28 (*)    Hemoglobin 7.8 (*)    HCT 23.0 (*)    All other components within normal limits  MRSA PCR SCREENING  APTT  PROTIME-INR  CBC  BASIC METABOLIC PANEL  MAGNESIUM  PHOSPHORUS   Imaging Review No results found.    MDM   Final diagnoses:  GIB (gastrointestinal bleeding)    Patient left AMA from the ICU earlier today. Presents to the ED actively vomiting blood and hypotensive. He is pale. His hemoglobin is decreased. He was emergently transfused some blood that was already ready for him from the ICU. Critical care was consult and  he was admitted back to the ICU. He was started on octreotide and a protonix drip  CRITICAL CARE Performed by: Billee Cashing Total critical care time: 30 Critical care time was exclusive of separately billable procedures and treating other patients. Critical care was necessary to treat or prevent imminent or life-threatening deterioration. Critical care was time spent personally by me on the following activities: development of treatment plan with patient and/or surrogate as well as nursing, discussions with consultants, evaluation of patient's response to treatment, examination of patient, obtaining history from patient or surrogate, ordering and performing treatments and interventions, ordering and review of laboratory studies, ordering and review of  radiographic studies, pulse oximetry and re-evaluation of patient's condition.    Juliet Rude. Rubin Payor, MD 04/25/13 2342

## 2013-04-25 NOTE — Progress Notes (Signed)
Became more and more anxious and agitated over the monitor lines, iv lines, npo status, unable to smoke decided to sign out of hospital AMA.  Dr. Herma Carson notified.  Paperwork signed.  Unable to reach daughter via telephone.  Left message.  Security notified.

## 2013-04-25 NOTE — ED Notes (Addendum)
Accidentally charted on wrong pt.

## 2013-04-25 NOTE — H&P (Signed)
Name: Tyrone [name] MRN: [id number] DOB: Jan 18, 1956    ADMISSION DATE:  04/25/2013   REFERRING MD :  Royston Bake. PRIMARY SERVICE: PCCM  CHIEF COMPLAINT:  GI bleed  BRIEF PATIENT DESCRIPTION:  58 yo WM transferred from [facility] for UGIB , transient hypotension.  SIGNIFICANT EVENTS / STUDIES:    LINES / TUBES:   CULTURES:   ANTIBIOTICS:   HISTORY OF PRESENT ILLNESS:   58 yo WM transferred from [facility] for UGIB , transient hypotension.  He was seen 6 days ago at [facility]. for similar issue but signed out AMA. He presented to [facility] with chief complaint of right calf pain and had negative LEDS and was being discharged when he vomited blood and passed out. [place] and [facility] were on diversion therefore he was transferred to , He will be placed on PPI drip, Octreotide   Drip and GI consult. 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 EGD was told (reliability?) for gastric avm?  PAST MEDICAL HISTORY :  Past Medical History  Diagnosis Date  . Myocardial infarction     2008   Past Surgical History  Procedure Laterality Date  . Splenectomy    . Coronary angioplasty with stent placement     Prior to Admission medications   Medication Sig Start Date End Date Taking? Authorizing Provider  HYDROcodone-acetaminophen (NORCO) 10-325 MG per tablet Take 1 tablet by mouth every 6 (six) hours as needed for pain. 03/26/12   Benny Lennert, MD  naproxen (NAPROSYN) 500 MG tablet Take 1 tablet (500 mg total) by mouth 2 (two) times daily. 03/26/12   Benny Lennert, MD  naproxen (NAPROSYN) 500 MG tablet Take 1 tablet (500 mg total) by mouth 2 (two) times daily with a meal. 03/31/12   Tammy L. Triplett, PA-C   Allergies  Allergen Reactions  . Nitroglycerin   . Toradol [Ketorolac Tromethamine]   . Ultram [Tramadol]     FAMILY HISTORY:  No family history on file. SOCIAL HISTORY:  reports that he has been smoking.  He does not have any smokeless tobacco  history on file. He reports that he does not drink alcohol. His drug history is not on file.  REVIEW OF SYSTEMS:  10 point review of system taken, please see HPI for positives and negatives.   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 Musculoskeletal:  Intact right calf  Skin:  warm  LABS:  CBC No results found for this basename: WBC, HGB, HCT, PLT,  in the last 168 hours Coag's No results found for this basename: APTT, INR,  in the last 168 hours BMET No results found for this basename: NA, K, CL, CO2, BUN, CREATININE, GLUCOSE,  in the last 168 hours Electrolytes No results found for this basename: CALCIUM, MG, PHOS,  in the last 168 hours Sepsis Markers No results found for this basename: LATICACIDVEN, PROCALCITON, O2SATVEN,  in the last 168 hours ABG No results found for this basename: PHART, PCO2ART, PO2ART,  in the last 168 hours Liver Enzymes No results found for this basename: AST, ALT, ALKPHOS, BILITOT, ALBUMIN,  in the last 168 hours Cardiac Enzymes No results found for this  basename: TROPONINI, PROBNP,  in the last 168 hours Glucose No results found for this basename: GLUCAP,  in the last 168 hours  Imaging No results found.   ASSESSMENT / PLAN:  PULMONARY A: COPD P:   BD as needed pcxr  CARDIOVASCULAR A: Hx of mi, resolved hemorrhagic shock P:  12 LEAD Follow pusle pressure closely Tele Volume ok  Cbc q6h  RENAL A:  At risk hyperk, s/p Tx P:   bmet now  GASTROINTESTINAL A:  Recurrent GIB P:   Serial cbc PPI drip Octreotide drip GI consult appreciated coags  HEMATOLOGIC A:  Blood loss anemia P:  Transfuse for hgb <7.0 coags now Cbc q6h  INFECTIOUS A:  No acute  issue P:  Follow fever curve  Endo octreotide risk for hypoglycemia cbg  NEUROLOGIC A:  Hx of TBI and bipolar P:   Takes xanax at home  TODAY'S SUMMARY:  58 [facility] , transient hypotension.  [facility] but signed out AMA. [facility] with chief complaint of right calf pain and had negative LEDS and was being discharged when he vomited blood and passed out. [facility] therefore he was transferred to [place], He will be placed on PPI drip, Octreotide   Drip and GI consult. 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  Ccm time 35 min  I have fully examined this patient and agree with above findings.    And edited in full  Mcarthur RossettiDaniel J. Tyson AliasFeinstein, MD, FACP Pgr: [phone] [place] [facility]  Brett CanalesSteve Minor ACNP [facility] Pager [phone] till 3 pm If no answer page [phone] 04/25/2013, 9:02 AM

## 2013-04-26 ENCOUNTER — Encounter (HOSPITAL_COMMUNITY)
Admission: EM | Disposition: A | Discharge status: Home / Self Care | Payer: Self-pay | Source: Home / Self Care | Attending: Critical Care Medicine

## 2013-04-26 ENCOUNTER — Inpatient Hospital Stay (HOSPITAL_COMMUNITY): Payer: Medicare Other

## 2013-04-26 ENCOUNTER — Encounter (HOSPITAL_COMMUNITY): Payer: Self-pay | Admitting: *Deleted

## 2013-04-26 ENCOUNTER — Encounter (HOSPITAL_COMMUNITY): Payer: Medicare Other | Admitting: Anesthesiology

## 2013-04-26 ENCOUNTER — Inpatient Hospital Stay (HOSPITAL_COMMUNITY): Payer: Medicare Other | Admitting: Anesthesiology

## 2013-04-26 DIAGNOSIS — K922 Gastrointestinal hemorrhage, unspecified: Secondary | ICD-10-CM

## 2013-04-26 DIAGNOSIS — J9600 Acute respiratory failure, unspecified whether with hypoxia or hypercapnia: Secondary | ICD-10-CM | POA: Diagnosis not present

## 2013-04-26 DIAGNOSIS — R578 Other shock: Secondary | ICD-10-CM | POA: Diagnosis present

## 2013-04-26 DIAGNOSIS — Q2733 Arteriovenous malformation of digestive system vessel: Secondary | ICD-10-CM

## 2013-04-26 DIAGNOSIS — D62 Acute posthemorrhagic anemia: Secondary | ICD-10-CM

## 2013-04-26 DIAGNOSIS — J441 Chronic obstructive pulmonary disease with (acute) exacerbation: Secondary | ICD-10-CM

## 2013-04-26 HISTORY — PX: LAPAROTOMY: SHX154

## 2013-04-26 HISTORY — PX: ESOPHAGOGASTRODUODENOSCOPY: SHX5428

## 2013-04-26 LAB — CBC WITH DIFFERENTIAL/PLATELET
Basophils Absolute: 0 10*3/uL (ref 0.0–0.1)
Basophils Relative: 0 % (ref 0–1)
EOS ABS: 0 10*3/uL (ref 0.0–0.7)
Eosinophils Relative: 0 % (ref 0–5)
HCT: 33.2 % — ABNORMAL LOW (ref 39.0–52.0)
HEMOGLOBIN: 11.8 g/dL — AB (ref 13.0–17.0)
LYMPHS ABS: 3.3 10*3/uL (ref 0.7–4.0)
LYMPHS PCT: 29 % (ref 12–46)
MCH: 30.8 pg (ref 26.0–34.0)
MCHC: 35.5 g/dL (ref 30.0–36.0)
MCV: 86.7 fL (ref 78.0–100.0)
MONOS PCT: 10 % (ref 3–12)
Monocytes Absolute: 1.1 10*3/uL — ABNORMAL HIGH (ref 0.1–1.0)
NEUTROS ABS: 6.9 10*3/uL (ref 1.7–7.7)
NEUTROS PCT: 61 % (ref 43–77)
Platelets: 22 10*3/uL — CL (ref 150–400)
RBC: 3.83 MIL/uL — AB (ref 4.22–5.81)
RDW: 14.6 % (ref 11.5–15.5)
WBC: 11.2 10*3/uL — AB (ref 4.0–10.5)

## 2013-04-26 LAB — CBC
HCT: 19.2 % — ABNORMAL LOW (ref 39.0–52.0)
HCT: 34.5 % — ABNORMAL LOW (ref 39.0–52.0)
HCT: 26.9 % — ABNORMAL LOW (ref 39.0–52.0)
HEMOGLOBIN: 12.5 g/dL — AB (ref 13.0–17.0)
Hemoglobin: 6.7 g/dL — CL (ref 13.0–17.0)
Hemoglobin: 9.6 g/dL — ABNORMAL LOW (ref 13.0–17.0)
MCH: 31.9 pg (ref 26.0–34.0)
MCH: 30.9 pg (ref 26.0–34.0)
MCH: 30.8 pg (ref 26.0–34.0)
MCHC: 34.9 g/dL (ref 30.0–36.0)
MCHC: 35.7 g/dL (ref 30.0–36.0)
MCHC: 36.2 g/dL — AB (ref 30.0–36.0)
MCV: 85.2 fL (ref 78.0–100.0)
MCV: 86.2 fL (ref 78.0–100.0)
MCV: 91.4 fL (ref 78.0–100.0)
PLATELETS: 62 10*3/uL — AB (ref 150–400)
PLATELETS: 80 10*3/uL — AB (ref 150–400)
Platelets: 181 10*3/uL (ref 150–400)
RBC: 2.1 MIL/uL — ABNORMAL LOW (ref 4.22–5.81)
RBC: 4.05 MIL/uL — ABNORMAL LOW (ref 4.22–5.81)
RBC: 3.12 MIL/uL — AB (ref 4.22–5.81)
RDW: 13.3 % (ref 11.5–15.5)
RDW: 15.6 % — ABNORMAL HIGH (ref 11.5–15.5)
RDW: 16.5 % — AB (ref 11.5–15.5)
WBC: 18.5 10*3/uL — ABNORMAL HIGH (ref 4.0–10.5)
WBC: 10 10*3/uL (ref 4.0–10.5)
WBC: 21.7 10*3/uL — AB (ref 4.0–10.5)

## 2013-04-26 LAB — BASIC METABOLIC PANEL
BUN: 7 mg/dL (ref 6–23)
BUN: 38 mg/dL — AB (ref 6–23)
BUN: 36 mg/dL — ABNORMAL HIGH (ref 6–23)
CHLORIDE: 115 meq/L — AB (ref 96–112)
CO2: 25 meq/L (ref 19–32)
CO2: 19 meq/L (ref 19–32)
CO2: 19 mEq/L (ref 19–32)
CREATININE: 0.88 mg/dL (ref 0.50–1.35)
Calcium: 8.3 mg/dL — ABNORMAL LOW (ref 8.4–10.5)
Calcium: 6.6 mg/dL — ABNORMAL LOW (ref 8.4–10.5)
Calcium: 6.7 mg/dL — ABNORMAL LOW (ref 8.4–10.5)
Chloride: 101 mEq/L (ref 96–112)
Chloride: 119 mEq/L — ABNORMAL HIGH (ref 96–112)
Creatinine, Ser: 1.02 mg/dL (ref 0.50–1.35)
Creatinine, Ser: 1.12 mg/dL (ref 0.50–1.35)
GFR calc Af Amer: >90 mL/min (ref >90)
GFR calc non Af Amer: >90 mL/min (ref >90)
GFR calc non Af Amer: 79 mL/min — ABNORMAL LOW (ref >90)
GFR calc non Af Amer: 71 mL/min — ABNORMAL LOW (ref >90)
GFR, EST AFRICAN AMERICAN: 82 mL/min — AB (ref >90)
GLUCOSE: 221 mg/dL — AB (ref 70–99)
GLUCOSE: 123 mg/dL — AB (ref 70–99)
Glucose, Bld: 140 mg/dL — ABNORMAL HIGH (ref 70–99)
POTASSIUM: 6.1 meq/L — AB (ref 3.7–5.3)
POTASSIUM: 4.2 meq/L (ref 3.7–5.3)
Potassium: 4.1 mEq/L (ref 3.7–5.3)
Sodium: 135 mEq/L — ABNORMAL LOW (ref 137–147)
Sodium: 142 mEq/L (ref 137–147)
Sodium: 147 mEq/L (ref 137–147)

## 2013-04-26 LAB — POCT I-STAT 7, (LYTES, BLD GAS, ICA,H+H)
Acid-base deficit: 12 mmol/L — ABNORMAL HIGH (ref 0.0–2.0)
Bicarbonate: 14.8 mEq/L — ABNORMAL LOW (ref 20.0–24.0)
Calcium, Ion: 0.89 mmol/L — ABNORMAL LOW (ref 1.12–1.23)
HCT: 24 % — ABNORMAL LOW (ref 39.0–52.0)
Hemoglobin: 8.2 g/dL — ABNORMAL LOW (ref 13.0–17.0)
O2 Saturation: 100 %
POTASSIUM: 5.4 meq/L — AB (ref 3.7–5.3)
Sodium: 142 mEq/L (ref 137–147)
TCO2: 16 mmol/L (ref 0–100)
pCO2 arterial: 37.8 mmHg (ref 35.0–45.0)
pH, Arterial: 7.202 — ABNORMAL LOW (ref 7.350–7.450)
pO2, Arterial: 500 mmHg — ABNORMAL HIGH (ref 80.0–100.0)

## 2013-04-26 LAB — POCT I-STAT 3, ART BLOOD GAS (G3+)
ACID-BASE DEFICIT: 10 mmol/L — AB (ref 0.0–2.0)
Acid-base deficit: 13 mmol/L — ABNORMAL HIGH (ref 0.0–2.0)
Acid-base deficit: 8 mmol/L — ABNORMAL HIGH (ref 0.0–2.0)
BICARBONATE: 18.4 meq/L — AB (ref 20.0–24.0)
Bicarbonate: 17.4 mEq/L — ABNORMAL LOW (ref 20.0–24.0)
Bicarbonate: 13.4 mEq/L — ABNORMAL LOW (ref 20.0–24.0)
O2 SAT: 100 %
O2 SAT: 99 %
O2 Saturation: 100 %
PO2 ART: 139 mmHg — AB (ref 80.0–100.0)
Patient temperature: 97.5
Patient temperature: 94.5
TCO2: 19 mmol/L (ref 0–100)
TCO2: 14 mmol/L (ref 0–100)
TCO2: 20 mmol/L (ref 0–100)
pCO2 arterial: 42.4 mmHg (ref 35.0–45.0)
pCO2 arterial: 30.9 mmHg — ABNORMAL LOW (ref 35.0–45.0)
pCO2 arterial: 37.4 mmHg (ref 35.0–45.0)
pH, Arterial: 7.217 — ABNORMAL LOW (ref 7.350–7.450)
pH, Arterial: 7.246 — ABNORMAL LOW (ref 7.350–7.450)
pH, Arterial: 7.288 — ABNORMAL LOW (ref 7.350–7.450)
pO2, Arterial: 521 mmHg — ABNORMAL HIGH (ref 80.0–100.0)
pO2, Arterial: 202 mmHg — ABNORMAL HIGH (ref 80.0–100.0)

## 2013-04-26 LAB — GLUCOSE, CAPILLARY
GLUCOSE-CAPILLARY: 115 mg/dL — AB (ref 70–99)
GLUCOSE-CAPILLARY: 139 mg/dL — AB (ref 70–99)
Glucose-Capillary: 330 mg/dL — ABNORMAL HIGH (ref 70–99)
Glucose-Capillary: 193 mg/dL — ABNORMAL HIGH (ref 70–99)
Glucose-Capillary: 112 mg/dL — ABNORMAL HIGH (ref 70–99)
Glucose-Capillary: 89 mg/dL (ref 70–99)

## 2013-04-26 LAB — PREPARE RBC (CROSSMATCH)

## 2013-04-26 LAB — MAGNESIUM: MAGNESIUM: 1.5 mg/dL (ref 1.5–2.5)

## 2013-04-26 LAB — HIV ANTIBODY (ROUTINE TESTING W REFLEX): HIV: NONREACTIVE

## 2013-04-26 LAB — PHOSPHORUS: Phosphorus: 2.9 mg/dL (ref 2.3–4.6)

## 2013-04-26 LAB — MRSA PCR SCREENING: MRSA BY PCR: NEGATIVE

## 2013-04-26 SURGERY — LAPAROTOMY, EXPLORATORY
Anesthesia: General | Site: Abdomen

## 2013-04-26 SURGERY — EGD (ESOPHAGOGASTRODUODENOSCOPY)
Anesthesia: Moderate Sedation

## 2013-04-26 MED ORDER — ETOMIDATE 2 MG/ML IV SOLN
20.0000 mg | Freq: Once | INTRAVENOUS | Status: AC
  Administered 2013-04-26: 20 mg via INTRAVENOUS

## 2013-04-26 MED ORDER — ROCURONIUM BROMIDE 50 MG/5ML IV SOLN
60.0000 mg | Freq: Once | INTRAVENOUS | Status: AC
  Administered 2013-04-26: 60 mg via INTRAVENOUS
  Filled 2013-04-26: qty 6

## 2013-04-26 MED ORDER — ROCURONIUM BROMIDE 100 MG/10ML IV SOLN
INTRAVENOUS | Status: DC | PRN
  Administered 2013-04-26 (×2): 50 mg via INTRAVENOUS

## 2013-04-26 MED ORDER — SODIUM CHLORIDE 0.9 % IV SOLN
INTRAVENOUS | Status: DC | PRN
  Administered 2013-04-26 (×2): via INTRAVENOUS

## 2013-04-26 MED ORDER — ONDANSETRON HCL 4 MG/2ML IJ SOLN: 4.0000 mg | Freq: Once | INTRAMUSCULAR | Status: AC | PRN

## 2013-04-26 MED ORDER — ALBUMIN HUMAN 5 % IV SOLN
INTRAVENOUS | Status: DC | PRN
  Administered 2013-04-26: 06:00:00 via INTRAVENOUS

## 2013-04-26 MED ORDER — NOREPINEPHRINE BITARTRATE 1 MG/ML IJ SOLN
2.0000 ug/min | INTRAVENOUS | Status: DC
  Administered 2013-04-26: 25 ug/min via INTRAVENOUS
  Administered 2013-04-26: 30 ug/min via INTRAVENOUS
  Administered 2013-04-26: 10 ug/min via INTRAVENOUS
  Administered 2013-04-26: 15 ug/min via INTRAVENOUS
  Administered 2013-04-26: 25 ug/min via INTRAVENOUS
  Filled 2013-04-26 (×2): qty 16

## 2013-04-26 MED ORDER — POVIDONE-IODINE 10 % EX OINT
TOPICAL_OINTMENT | CUTANEOUS | Status: AC
  Filled 2013-04-26: qty 28.35

## 2013-04-26 MED ORDER — SODIUM CHLORIDE 0.9 % IV SOLN
0.0000 ug/h | INTRAVENOUS | Status: DC
  Administered 2013-04-26: 200 ug/h via INTRAVENOUS
  Administered 2013-04-26: 150 ug/h via INTRAVENOUS
  Administered 2013-04-26: 100 ug/h via INTRAVENOUS
  Filled 2013-04-26 (×2): qty 50

## 2013-04-26 MED ORDER — MIDAZOLAM HCL 2 MG/2ML IJ SOLN
INTRAMUSCULAR | Status: DC | PRN
  Administered 2013-04-26: 2 mg via INTRAVENOUS

## 2013-04-26 MED ORDER — SODIUM POLYSTYRENE SULFONATE 15 GM/60ML PO SUSP
60.0000 g | Freq: Once | ORAL | Status: AC
  Administered 2013-04-26: 60 g via RECTAL
  Filled 2013-04-26: qty 240

## 2013-04-26 MED ORDER — DEXTROSE 5 % IV SOLN
2.0000 g | INTRAVENOUS | Status: AC
  Administered 2013-04-26: 2 g via INTRAVENOUS
  Filled 2013-04-26: qty 2

## 2013-04-26 MED ORDER — HYDROMORPHONE HCL PF 1 MG/ML IJ SOLN: 0.2500 mg | INTRAMUSCULAR | Status: DC | PRN

## 2013-04-26 MED ORDER — FENTANYL CITRATE 0.05 MG/ML IJ SOLN
INTRAMUSCULAR | Status: DC | PRN
  Administered 2013-04-26: 150 ug via INTRAVENOUS
  Administered 2013-04-26: 100 ug via INTRAVENOUS

## 2013-04-26 MED ORDER — SODIUM BICARBONATE 4.2 % IV SOLN
INTRAVENOUS | Status: DC | PRN
  Administered 2013-04-26 (×2): 50 meq via INTRAVENOUS

## 2013-04-26 MED ORDER — DEXMEDETOMIDINE HCL IN NACL 200 MCG/50ML IV SOLN
0.4000 ug/kg/h | INTRAVENOUS | Status: DC
  Administered 2013-04-26: 0.4 ug/kg/h via INTRAVENOUS
  Administered 2013-04-26: 0.6 ug/kg/h via INTRAVENOUS
  Administered 2013-04-26: 0.4 ug/kg/h via INTRAVENOUS
  Administered 2013-04-26 (×2): 0.7 ug/kg/h via INTRAVENOUS
  Administered 2013-04-26: 0.6 ug/kg/h via INTRAVENOUS
  Administered 2013-04-27: 1.2 ug/kg/h via INTRAVENOUS
  Administered 2013-04-27: 0.5 ug/kg/h via INTRAVENOUS
  Administered 2013-04-27: 0.4 ug/kg/h via INTRAVENOUS
  Administered 2013-04-27: 0.6 ug/kg/h via INTRAVENOUS
  Filled 2013-04-26 (×9): qty 50

## 2013-04-26 MED ORDER — FENTANYL CITRATE 0.05 MG/ML IJ SOLN
100.0000 ug | Freq: Once | INTRAMUSCULAR | Status: AC
  Administered 2013-04-26: 100 ug via INTRAVENOUS

## 2013-04-26 MED ORDER — INSULIN ASPART 100 UNIT/ML ~~LOC~~ SOLN
2.0000 [IU] | SUBCUTANEOUS | Status: DC
  Administered 2013-04-27 (×3): 2 [IU] via SUBCUTANEOUS

## 2013-04-26 MED ORDER — PHENYLEPHRINE HCL 10 MG/ML IJ SOLN
30.0000 ug/min | INTRAVENOUS | Status: DC
  Administered 2013-04-26: 200 ug/min via INTRAVENOUS
  Administered 2013-04-26: 40 ug/min via INTRAVENOUS
  Administered 2013-04-26: 200 ug/min via INTRAVENOUS
  Filled 2013-04-26 (×2): qty 4

## 2013-04-26 MED ORDER — BIOTENE DRY MOUTH MT LIQD
15.0000 mL | Freq: Four times a day (QID) | OROMUCOSAL | Status: DC
  Administered 2013-04-26 – 2013-05-01 (×15): 15 mL via OROMUCOSAL

## 2013-04-26 MED ORDER — FENTANYL BOLUS VIA INFUSION
50.0000 ug | INTRAVENOUS | Status: DC | PRN
  Filled 2013-04-26: qty 100

## 2013-04-26 MED ORDER — SODIUM CHLORIDE 0.9 % IV BOLUS (SEPSIS)
1000.0000 mL | Freq: Once | INTRAVENOUS | Status: AC
  Administered 2013-04-26: 1000 mL via INTRAVENOUS

## 2013-04-26 MED ORDER — CHLORHEXIDINE GLUCONATE 0.12 % MT SOLN: 15.0000 mL | Freq: Two times a day (BID) | OROMUCOSAL | Status: DC

## 2013-04-26 MED ORDER — MIDAZOLAM HCL 2 MG/2ML IJ SOLN
INTRAMUSCULAR | Status: AC
  Administered 2013-04-26: 2 mg
  Filled 2013-04-26: qty 2

## 2013-04-26 MED ORDER — ROCURONIUM BROMIDE 50 MG/5ML IV SOLN
INTRAVENOUS | Status: AC
  Filled 2013-04-26: qty 1

## 2013-04-26 MED ORDER — FENTANYL CITRATE 0.05 MG/ML IJ SOLN
INTRAMUSCULAR | Status: AC
  Administered 2013-04-26: 100 ug via INTRAVENOUS
  Filled 2013-04-26: qty 2

## 2013-04-26 MED ORDER — 0.9 % SODIUM CHLORIDE (POUR BTL) OPTIME
TOPICAL | Status: DC | PRN
  Administered 2013-04-26: 3000 mL
  Administered 2013-04-26: 2000 mL

## 2013-04-26 MED ORDER — LORAZEPAM 2 MG/ML IJ SOLN: 0.5000 mg | Freq: Four times a day (QID) | INTRAMUSCULAR | Status: DC

## 2013-04-26 MED ORDER — FENTANYL CITRATE 0.05 MG/ML IJ SOLN
50.0000 ug | Freq: Once | INTRAMUSCULAR | Status: AC
  Administered 2013-04-26: 100 ug via INTRAVENOUS

## 2013-04-26 MED ORDER — ALBUTEROL SULFATE (2.5 MG/3ML) 0.083% IN NEBU: 2.5000 mg | INHALATION_SOLUTION | RESPIRATORY_TRACT | Status: DC | PRN

## 2013-04-26 MED ORDER — MIDAZOLAM HCL 2 MG/2ML IJ SOLN
2.0000 mg | Freq: Once | INTRAMUSCULAR | Status: AC
  Administered 2013-04-26: 2 mg via INTRAVENOUS

## 2013-04-26 MED ORDER — CALCIUM CHLORIDE 10 % IV SOLN
INTRAVENOUS | Status: DC | PRN
  Administered 2013-04-26: 1 g via INTRAVENOUS

## 2013-04-26 MED ORDER — LORAZEPAM 2 MG/ML IJ SOLN
0.5000 mg | Freq: Four times a day (QID) | INTRAMUSCULAR | Status: DC
  Administered 2013-04-26 – 2013-04-27 (×4): 0.5 mg via INTRAVENOUS
  Filled 2013-04-26 (×4): qty 1

## 2013-04-26 MED ORDER — DEXTROSE 5 % IV SOLN
30.0000 ug/min | INTRAVENOUS | Status: DC
  Administered 2013-04-26: 30 ug/min via INTRAVENOUS
  Filled 2013-04-26: qty 2

## 2013-04-26 MED ORDER — CHLORHEXIDINE GLUCONATE 0.12 % MT SOLN
15.0000 mL | Freq: Two times a day (BID) | OROMUCOSAL | Status: DC
  Administered 2013-04-26 – 2013-05-02 (×12): 15 mL via OROMUCOSAL
  Filled 2013-04-26 (×15): qty 15

## 2013-04-26 SURGICAL SUPPLY — 64 items
BLADE SURG ROTATE 9660 (MISCELLANEOUS) IMPLANT
CANISTER SUCTION 2500CC (MISCELLANEOUS) ×4 IMPLANT
CATH FOLEY 2WAY SLVR  5CC 20FR (CATHETERS)
CATH FOLEY 2WAY SLVR  5CC 22FR (CATHETERS)
CATH FOLEY 2WAY SLVR 5CC 20FR (CATHETERS) IMPLANT
CATH FOLEY 2WAY SLVR 5CC 22FR (CATHETERS) IMPLANT
CHLORAPREP W/TINT 26ML (MISCELLANEOUS) ×4 IMPLANT
COVER MAYO STAND STRL (DRAPES) IMPLANT
COVER SURGICAL LIGHT HANDLE (MISCELLANEOUS) ×4 IMPLANT
DRAIN PENROSE 1/2X36 STERILE (WOUND CARE) ×4 IMPLANT
DRAPE LAPAROSCOPIC ABDOMINAL (DRAPES) ×4 IMPLANT
DRAPE PROXIMA HALF (DRAPES) IMPLANT
DRAPE UTILITY 15X26 W/TAPE STR (DRAPE) ×8 IMPLANT
DRAPE WARM FLUID 44X44 (DRAPE) ×4 IMPLANT
DRSG OPSITE POSTOP 4X10 (GAUZE/BANDAGES/DRESSINGS) IMPLANT
DRSG OPSITE POSTOP 4X8 (GAUZE/BANDAGES/DRESSINGS) IMPLANT
ELECT BLADE 6.5 EXT (BLADE) ×4 IMPLANT
ELECT CAUTERY BLADE 6.4 (BLADE) ×4 IMPLANT
ELECT REM PT RETURN 9FT ADLT (ELECTROSURGICAL) ×4
ELECTRODE REM PT RTRN 9FT ADLT (ELECTROSURGICAL) ×2 IMPLANT
GLOVE BIO SURGEON STRL SZ7.5 (GLOVE) ×4 IMPLANT
GLOVE BIO SURGEON STRL SZ8 (GLOVE) ×4 IMPLANT
GLOVE BIOGEL PI IND STRL 7.5 (GLOVE) ×2 IMPLANT
GLOVE BIOGEL PI IND STRL 8 (GLOVE) ×6 IMPLANT
GLOVE BIOGEL PI INDICATOR 7.5 (GLOVE) ×2
GLOVE BIOGEL PI INDICATOR 8 (GLOVE) ×6
GLOVE ECLIPSE 7.5 STRL STRAW (GLOVE) ×8 IMPLANT
GLOVE SURG SS PI 7.5 STRL IVOR (GLOVE) ×8 IMPLANT
GOWN STRL NON-REIN LRG LVL3 (GOWN DISPOSABLE) ×12 IMPLANT
KIT BASIN OR (CUSTOM PROCEDURE TRAY) ×4 IMPLANT
KIT ROOM TURNOVER OR (KITS) ×4 IMPLANT
LIGASURE IMPACT 36 18CM CVD LR (INSTRUMENTS) ×4 IMPLANT
NS IRRIG 1000ML POUR BTL (IV SOLUTION) ×8 IMPLANT
PACK GENERAL/GYN (CUSTOM PROCEDURE TRAY) ×4 IMPLANT
PAD ARMBOARD 7.5X6 YLW CONV (MISCELLANEOUS) ×4 IMPLANT
PENCIL BUTTON HOLSTER BLD 10FT (ELECTRODE) IMPLANT
RELOAD AUTO 90-3.5 TA90 BLE (ENDOMECHANICALS) IMPLANT
RELOAD AUTO 90-4.8 TA90 GRN (ENDOMECHANICALS) IMPLANT
SEPRAFILM PROCEDURAL PACK 3X5 (MISCELLANEOUS) IMPLANT
SPECIMEN JAR LARGE (MISCELLANEOUS) IMPLANT
SPONGE GAUZE 4X4 12PLY (GAUZE/BANDAGES/DRESSINGS) ×4 IMPLANT
SPONGE LAP 18X18 X RAY DECT (DISPOSABLE) IMPLANT
STAPLER 90 3.5 STAND SLIM (STAPLE)
STAPLER 90 3.5 STD SLIM (STAPLE) IMPLANT
STAPLER TA90 4.8 THK SLIMI (STAPLE) IMPLANT
STAPLER VISISTAT 35W (STAPLE) ×4 IMPLANT
SUCTION POOLE TIP (SUCTIONS) ×4 IMPLANT
SUT NOVA 1 T20/GS 25DT (SUTURE) IMPLANT
SUT PDS AB 1 TP1 96 (SUTURE) ×8 IMPLANT
SUT SILK 2 0 SH CR/8 (SUTURE) ×16 IMPLANT
SUT SILK 2 0 TIES 10X30 (SUTURE) ×4 IMPLANT
SUT SILK 3 0 SH CR/8 (SUTURE) ×4 IMPLANT
SUT SILK 3 0 TIES 10X30 (SUTURE) ×4 IMPLANT
SUT VIC AB 3-0 SH 27 (SUTURE) ×8
SUT VIC AB 3-0 SH 27X BRD (SUTURE) ×8 IMPLANT
TOWEL OR 17X24 6PK STRL BLUE (TOWEL DISPOSABLE) ×4 IMPLANT
TOWEL OR 17X26 10 PK STRL BLUE (TOWEL DISPOSABLE) ×4 IMPLANT
TRAY FOLEY CATH 14FRSI W/METER (CATHETERS) ×4 IMPLANT
TRAY FOLEY CATH 16FRSI W/METER (SET/KITS/TRAYS/PACK) IMPLANT
TUBE CONNECTING 12'X1/4 (SUCTIONS)
TUBE CONNECTING 12X1/4 (SUCTIONS) IMPLANT
TUBE MOSS GAS 18FR (TUBING) IMPLANT
WATER STERILE IRR 1000ML POUR (IV SOLUTION) IMPLANT
YANKAUER SUCT BULB TIP NO VENT (SUCTIONS) IMPLANT

## 2013-04-26 NOTE — Progress Notes (Signed)
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  SIGNIFICANT EVENTS / STUDIES:  2/12  Signed out AMA, re-admitted an hour later 2/12  Transfused 4 units of PRBC 2/13  Endoscopy >>> active bleeding at greater curvature 2/13  OR >>> Exploratory lap, oversew of gastric AV malformation, repair of gastrotomy  LINES / TUBES:  OETT 2/13 >>> NGT 2/13 >>> RIJ TLC 2/13 >>> Foley 2/13 >>> R rad a-line ??? >>>  CULTURES:   ANTIBIOTICS:   INTERVAL HISTORY:  No active issues overnight  VITAL SIGNS: Temp:  [93.5 F (34.2 C)-101.8 F (38.8 C)] 101.2 F (38.4 C) (02/13 1815) Pulse Rate:  [58-157] 77 (02/13 1815) Resp:  [4-30] 18 (02/13 1815) BP: (66-185)/(24-123) 82/48 mmHg (02/13 1800) SpO2:  [92 %-100 %] 100 % (02/13 1815) FiO2 (%):  [40 %-100 %] 40 % (02/13 1743) Weight:  [68 kg (149 lb 14.6 oz)-69.3 kg (152 lb 12.5 oz)] 69.3 kg (152 lb 12.5 oz) (02/13 0130)  HEMODYNAMICS: CVP:  [4 mmHg-8 mmHg] 8 mmHg  VENTILATOR SETTINGS: Vent Mode:  [-] PRVC FiO2 (%):  [40 %-100 %] 40 % Set Rate:  [14 bmp-35 bmp] 18 bmp Vt Set:  [580 mL] 580 mL PEEP:  [5 cmH20] 5 cmH20 Plateau Pressure:  [12 cmH20-15 cmH20] 12 cmH20  INTAKE / OUTPUT: Intake/Output     02/12 0701 - 02/13 0700 02/13 0701 - 02/14 0700   I.V. (mL/kg) 4654.3 (67.2) 2485.1 (35.9)   Blood 3708.9 260   IV Piggyback 250    Total Intake(mL/kg) 8613.2 (124.3) 2745.1 (39.6)   Urine (mL/kg/hr) 125 605 (0.8)   Other 700    Blood  800 (1)   Total Output 825 1405   Net +7788.2 +1340.1        Stool Occurrence 1 x    Emesis Occurrence 1 x     PHYSICAL EXAMINATION: General:  Appears acutely ill, mechanically ventilated, synchronous Neuro:  Follows commands, nonfocal, cough / gag  diminished HEENT:  PERRL, OETT / OGT Cardiovascular:  RRR, no m/r/g Lungs:  Bilateral diminished air entry, no w/r/r Abdomen:  Soft, nontender, bowel sounds diminished, surgical dressing intact Musculoskeletal:  Moves all extremities, no edema Skin:  Intact  LABS:  CBC  Recent Labs Lab 04/26/13 0014 04/26/13 0535 04/26/13 0618 04/26/13 0755  WBC 18.5* 10.0  --  11.2*  HGB 6.7* 12.5* 8.2* 11.8*  HCT 19.2* 34.5* 24.0* 33.2*  PLT 80* 181  --  22*   Coag's  Recent Labs Lab 04/25/13 1040 04/25/13 1242 04/25/13 2050  APTT 34 34 30  INR 1.13 1.15 1.09   BMET  Recent Labs Lab 04/25/13 1040 04/25/13 2105 04/26/13 0535 04/26/13 0618 04/26/13 0755  NA 140 142 135* 142 142  K 5.0 3.6* 4.1 5.4* 6.1*  CL 108 106 101  --  115*  CO2 22  --  25  --  19  BUN 38* 39* 7  --  38*  CREATININE 0.69 1.00 0.88  --  1.02  GLUCOSE 109* 147* 140*  --  221*   Electrolytes  Recent Labs Lab 04/25/13 1040 04/26/13 0535 04/26/13 0755  CALCIUM 8.5 8.3* 6.6*  MG 1.9 1.5  --   PHOS 2.4 2.9  --    Sepsis Markers  Recent Labs Lab 04/25/13 1040  LATICACIDVEN 1.1  PROCALCITON <0.10   ABG  Recent Labs Lab 04/26/13 0456 04/26/13 0618 04/26/13 0820  PHART 7.246* 7.202* 7.288*  PCO2ART 30.9* 37.8 37.4  PO2ART 202.0* 500.0* 139.0*   Liver Enzymes  Recent Labs Lab 04/25/13 1040  AST 27  ALT 21  ALKPHOS 54  BILITOT 0.4  ALBUMIN 2.6*   Cardiac Enzymes  Recent Labs Lab 04/25/13 1040  TROPONINI <0.30   Glucose  Recent Labs Lab 04/25/13 2358 04/26/13 0358 04/26/13 0818 04/26/13 1130 04/26/13 1551  GLUCAP 139* 330* 193* 112* 89   CXR: 2/14 >>> Hardware in good position, no overt airspace dsease  ASSESSMENT / PLAN:  PULMONARY  A:  Acute respiratory failure in setting of hemorrhagic shock / enecphalopathy COPD without exacerbation P:  SBT with intent to extubate Albuterol PRN  CARDIOVASCULAR  A:  Hemorrhagic shock, resolving  P:  Goal MAP >  65 D/c Levophed gtt D/c Neo-Synephrine gtt  RENAL  A: AKI Hyperkalemia P:  Trend BMP NS@100   GASTROINTESTINAL  A:  Gastric AVM s/p surgical repair Upper GI hemorrhage GI Px P:  GI / CCS following NPO D/c Protonix gtt D/c Octreotide gtt Start Protonix BID  HEMATOLOGIC  A:  Acute blood loss anemia, stable P:  Goal Hb>7 Trend CBC  INFECTIOUS  A:  No active issues P: No intervention required  ENDOCRINE A: Hyperglycemia P:  SSI  NEUROLOGIC  A:  Acute encephalopathy Bipolar  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  CRITICAL CARE:  The patient is critically ill with multiple organ systems failure and requires high complexity decision making for assessment and support, frequent evaluation and titration of therapies, application of advanced monitoring technologies and extensive interpretation of multiple databases. Critical Care Time devoted to patient care services described in this note is 40 minutes.   Lonia Farber, MD Pulmonary and Critical Care Medicine [facility] Pager: [phone]  04/26/2013, 6:30 PM

## 2013-04-26 NOTE — Op Note (Addendum)
OPERATIVE REPORT  DATE OF OPERATION:  04/26/2013  PATIENT:  Tyrone [name]  58 y.o. male  PRE-OPERATIVE DIAGNOSIS:  Upper GI bleeding  POST-OPERATIVE DIAGNOSIS:  Upper GI bleeding  PROCEDURE:  Procedure(s): EXPLORATORY LAPAROTOMY, OVERSEW OF GASTRIC AV MALFORMATION, REPAIR OF GASTROTOMY  SURGEON:  Surgeon(s): Cherylynn Ridges, MD Liz Malady, MD  ASSISTANT: Janee Morn, M.D.  ANESTHESIA:   general  EBL: 450 ml  BLOOD ADMINISTERED: 1000 CC PRBC and 400 FFP  DRAINS: Nasogastric Tube and Urinary Catheter (Foley)   SPECIMEN:  No Specimen  COUNTS CORRECT:  YES  PROCEDURE DETAILS: The patient was taken to the operating room and placed on the table in the supine position. He was intubated from the intensive care unit. An emergency consent had been obtained. He was prepped and draped in the usual sterile manner exposing his entire abdomen.  A proper time out was performed identifying the patient and the procedure to be performed. An upper midline incision was made from the xiphoid down to the umbilicus. Were taken down to and through the midline fascia. Attached to the anterior abdominal wall in the peritoneal cavity were omental adhesions which extended into the left upper quadrant consistent with his past history of a previous splenectomy. A LigaSure device, Kelly clamps and 2-0 silk ties, and electrocautery were used to take down the omental adhesions on the left side as they were blocking access to the stomach.  Once we had adequate access to the anterior wall of the stomach 2 stay sutures of 2-0 silk were placed on the intra-abdominal wall approximately 3 cm from the greater curvature. This is also in the distal portion of the fundus. We subsequently made a gastrotomy using electrocautery into the stomach opening about. We ended up extending this gastrotomy to total length of approximately 10-15 cm proximally. We stayed well away from the gastroesophageal junction.  Once we were in the  stomach large clots of blood measuring up to 3 and 400 cc were removed from the stomach as we were able to clear out the clot to get adequate exposure of the mucosal surface, with retractors in place we could see a pumping blood vessel on the greater curvature of the stomach in an area that was not ulcerated and not indurated. It appeared to be a purplish blue area with some exposed vessels that were actively bleeding. This may represent an area of an AV malformation that had previously been identified at an outside institution.  This area of bleeding and exposed vessels of the AV malformation on the greater curvature the stoma was oversewn with 4 figure-of-eight stitches of 2-0 silk on the mucosal side. Once this was done the bleeding was controlled. We irrigated the stomach with saline solution and closed the gastrotomy using a 2 layered closure.  The mucosal layer was reapproximated using a running 3-0 Vicryl suture. This was oversewn by lambert stitches of interrupted 2-0 silk on the outside surface.  There was some oozing in the left upper quadrant where the adhesions had been taken down and electrocautery was used to obtain hemostasis. We subsequently closed the abdomen using looped #1 PDS suture. All counts were correct including needles, sponges, and instruments.  PATIENT DISPOSITION:  ICU - intubated and critically ill.   Cherylynn Ridges 2/13/20157:51 AM

## 2013-04-26 NOTE — Op Note (Signed)
Tyrone [name] [facility] [address] [place], [address]   ENDOSCOPY PROCEDURE REPORT  PATIENT: Tyrone, [name]  MR#: [id number] BIRTHDATE: [birth year] , 57  yrs. old GENDER: Male ENDOSCOPIST: Wandalee Ferdinand, MD REFERRED BY: Dr. Shan Levans PROCEDURE DATE:  04/26/2013 PROCEDURE:   EGD ASA CLASS: 3 INDICATIONS: upper GI bleed MEDICATIONS: the patient was medicated by the ICU team, the patient is on a ventilator TOPICAL ANESTHETIC: none  DESCRIPTION OF PROCEDURE:    informed consent was unable to be obtained just prior to the procedure due to to the emergent nature and due to the fact the patient was on a ventilator, unresponsive and sedated, and no family was available, however the procedure had been discussed with the patient earlier by me when he was awake and seen in the emergency room, and he was agreeable verbally to have the EGD done. The Pentax       endoscope was introduced through the mouth and advanced to the second portion of the duodenum      , limited by Without limitations.   The instrument was slowly withdrawn as the mucosa was fully examined.  Esophagus: There were no lesions seen in the esophagus. There was no evidence of active bleeding from the esophagus. There were no esophageal varices. The orogastric tube was in place.  Stomach: There was staining of blood on the mucosa in the antrum of the stomach as noted on image #004 but there was no lesion or evidence of active bleeding from the antrum of the stomach. The lesser curvature of the stomach also had staining of blood but no evidence of active bleeding or lesion to explain active bleeding. The fundus and cardia of the stomach did not reveal any evidence of active bleeding or lesion to explain bleeding. On the greater curvature of the stomach there was a large dependent clot as seen on image 009. It appeared that there was active bleeding accumulating from underneath this clot. I rolled the  patient from side to side to try to move the clot but could not see any specific site of bleeding. The active bleeding however does seem to be coming from under the clot on the greater curvature. No specific therapeutic intervention could be done endoscopically due to the fact that the exact site of bleeding could not be visualized.  Duodenum: No lesions seen in the bulb or second portion.      FINDINGS:see above. Active bleeding appears to be coming from the greater curvature underneath a dependent clot  COMPLICATIONS:none  ENDOSCOPIC IMPRESSION:see above   RECOMMENDATIONS: continue blood transfusions. The patient has already received 3 units of blood and is getting ready to get his fourth unit. Continue protonix drip,and continue octreotide. A surgical consult has been called and I spoke with Dr. Lindie Spruce.      _______________________________ Rosalie Doctor:  Wandalee Ferdinand, MD 04/26/2013 3:56 AM       PATIENT NAME:  Tyrone [name], Tyrone [name] MR#: [id number]

## 2013-04-26 NOTE — Progress Notes (Signed)
Name: Tyrone [name] MRN: [id number] DOB: 08/23/1955    ADMISSION DATE:  04/25/2013 CONSULTATION DATE:  04/25/2013  PRIMARY SERVICE: PCCM  CHIEF COMPLAINT:  Recurrent GI bleed  BRIEF PATIENT DESCRIPTION:  58 yo WM transferred from [facility] on 2/12 for UGIB , transient hypotension. He was seen 6 days prior at [facility]. for similar issue but signed out AMA. He was admitted to [facility] service, placed on PPI drip, Octreotide Drip and GI called. Per pt he had: EGD was told (reliability?) for gastric avm? Later in the afternoon on 2/12 he became more anxious and agitated. He left AMA at 1821. He returned within an hour to the ER having passed large volume dk bloody stool and vomiting large volume of blood. PCCM re-admitted.   SIGNIFICANT EVENTS / STUDIES:  2/12: left ama, re-admitted a hour after.  2/12: 4 units of blood 2/13: Endoscopy: active bleeding at greater curvature  LINES / TUBES:  PIV 2/13 RIJ 2/13>> ETT>> 2/13 NG 2/13>>  CULTURES:  none   ANTIBIOTICS:  none   SUBJECTIVE: Sedated. Intubated.  Back from OR for UGI bleeding.  VITAL SIGNS: Temp:  [96.3 F (35.7 C)-99.4 F (37.4 C)] 96.8 F (36 C) (02/13 0500) Pulse Rate:  [62-157] 62 (02/13 0745) Resp:  [12-30] 14 (02/13 0745) BP: (66-185)/(24-123) 80/53 mmHg (02/13 0745) SpO2:  [92 %-100 %] 100 % (02/13 0745) FiO2 (%):  [40 %-100 %] 40 % (02/13 0821) Weight:  [149 lb 14.6 oz (68 kg)-152 lb 12.5 oz (69.3 kg)] 152 lb 12.5 oz (69.3 kg) (02/13 0130) HEMODYNAMICS: CVP:  [4 mmHg] 4 mmHg VENTILATOR SETTINGS: Vent Mode:  [-] PRVC FiO2 (%):  [40 %-100 %] 40 % Set Rate:  [14 bmp-35 bmp] 18 bmp Vt Set:  [580 mL] 580 mL PEEP:  [5 cmH20] 5 cmH20 Plateau Pressure:  [13 cmH20-15 cmH20] 15 cmH20 INTAKE / OUTPUT: Intake/Output     02/12 0701 - 02/13 0700 02/13 0701 - 02/14 0700   I.V. (mL/kg) 4654.3 (67.2)    Blood 3708.9    IV Piggyback 250    Total Intake(mL/kg) 8613.2 (124.3)    Urine (mL/kg/hr) 125    Other 700    Blood  800 (8)   Total Output 825 800   Net +7788.2 -800        Stool Occurrence 1 x    Emesis Occurrence 1 x     PHYSICAL EXAMINATION: General:Sedated. Intubated. Neuro:Sedated. Intubated Cardiovascular: rrr. Mildly bradycardic intermittently  Lungs: CTAB, no wheezing or rales.  Abdomen: Soft, ND. BS present Skin: Dry, intact, well perfused   LABS:  CBC  Recent Labs Lab 04/25/13 2050  04/26/13 0014 04/26/13 0535 04/26/13 0618  WBC 17.8*  18.2*  --  18.5* 10.0  --   HGB 7.9*  7.8*  < > 6.7* 12.5* 8.2*  HCT 22.2*  22.6*  < > 19.2* 34.5* 24.0*  PLT 140*  147*  --  80* 181  --   < > = values in this interval not displayed. Coag's  Recent Labs Lab 04/25/13 1040 04/25/13 1242 04/25/13 2050  APTT 34 34 30  INR 1.13 1.15 1.09   BMET  Recent Labs Lab 04/25/13 1040 04/25/13 2105 04/26/13 0535 04/26/13 0618  NA 140 142 135* 142  K 5.0 3.6* 4.1 5.4*  CL 108 106 101  --   CO2 22  --  25  --   BUN 38* 39* 7  --   CREATININE 0.69 1.00 0.88  --  GLUCOSE 109* 147* 140*  --    Electrolytes  Recent Labs Lab 04/25/13 1040 04/26/13 0535  CALCIUM 8.5 8.3*  MG 1.9 1.5  PHOS 2.4 2.9   Sepsis Markers  Recent Labs Lab 04/25/13 1040  LATICACIDVEN 1.1  PROCALCITON <0.10   ABG  Recent Labs Lab 04/26/13 0222 04/26/13 0456 04/26/13 0618  PHART 7.217* 7.246* 7.202*  PCO2ART 42.4 30.9* 37.8  PO2ART 521.0* 202.0* 500.0*   Liver Enzymes  Recent Labs Lab 04/25/13 1040  AST 27  ALT 21  ALKPHOS 54  BILITOT 0.4  ALBUMIN 2.6*   Cardiac Enzymes  Recent Labs Lab 04/25/13 1040  TROPONINI <0.30   Glucose  Recent Labs Lab 04/25/13 2358 04/26/13 0358  GLUCAP 139* 330*    Imaging Dg Chest Port 1 View  04/26/2013   CLINICAL DATA:  Central venous line placement  EXAM: PORTABLE CHEST - 1 VIEW  COMPARISON:  None.  FINDINGS: The heart size and mediastinal contours are within normal limits. Endotracheal tube is identified distal tip 4.9 cm from  carina. A right jugular central venous line is identified distal tip in superior vena cava. A nasogastric tube is identified with distal tip not included on film but is at least in stomach. There is no focal infiltrate, pulmonary edema, or pleural effusion. There is no pneumothorax. The visualized skeletal structures are unremarkable.  IMPRESSION: Right jugular central venous line distal tip in superior vena cava. There is no pneumothorax.   Electronically Signed   By: Abelardo Diesel M.D.   On: 04/26/2013 01:54    ASSESSMENT / PLAN:  PULMONARY  A: COPD      Transient dyspnea in setting of GIB       Intubated, resp failure P:  - ABG noted, vent adjusted, will order a f/u ABG. - BD as needed  - ABG (met acidosis with resp acidosis)  and CXR in the am  CARDIOVASCULAR  A: Hx of mi, hemorrhagic shock  P:  - Hypotension: Levophed.  Map goal >65 - S/P transfusion x4 RBC - Cbc q6h  - Follow CVP.  RENAL  A: mild AKI  P:  - IV hydration (100 ml NS /hr) - Potassium elevated (5.4) - Kayexalate PR ordered x1.  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  HEMATOLOGIC  A: Blood loss anemia  P:  - Transfuse for hgb <7.0, 4 units RBC. - Follow coags and Hb (8.2).  INFECTIOUS  A: No acute issue  P: Follow fever curve   Endo  A: octreotide risk for hypoglycemia  P:  - CBGs. - CBG (last 3).  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 - Fentanyl. - Precedex.  CC:  Kuneff, ReneeDO PGY-2 04/26/2013, 8:27 AM  CC time 35 min.  Summary: Will maintain sedated and intubated for today, titrate  pressors as needed.  Will begin PS trials in AM once more stable from a hemodynamic standpoint, hold TF until ok with surgery.  Appreciate input from GI.  Patient seen and examined, agree with  above note.  I dictated the care and orders written for this patient under my direction.  Rush Farmer, MD [phone]

## 2013-04-26 NOTE — Progress Notes (Signed)
INITIAL NUTRITION ASSESSMENT  DOCUMENTATION CODES Per approved criteria  -Not Applicable   INTERVENTION:  If TF initiated, recommend utilizing PEPuP protocol: initiate TF via OGT with Vital AF 1.2 at 25 ml/hr on day 1; on day 2  increase to goal rate of 60 ml/hr (1440 ml per day) to provide 1728 kcal (99% of estimated needs), 108 grams protein, and 1167 ml free water.  RD to monitor patient care plan  NUTRITION DIAGNOSIS: Inadequate oral intake related to inability to eat as evidenced by NPO status.   Goal: Patient to meet >/=90% of estimated nutrition needs  Monitor:  Vent status, TF initiation, PO diet advancement, lab trends, weight trends  Reason for Assessment: New Ventilator   58 y.o. male  Admitting Dx: GIB (gastrointestinal bleeding)  ASSESSMENT: Patient transferred from [facility] on 2/12 for UGIB , transient hypotension. He was seen 6 days prior at [facility]. for similar issue but signed out AMA. He was admitted to [facility] service, placed on PPI drip, Octreotide Drip and GI called. Per pt he had: EGD was told (reliability?) for gastric avm? Later in the afternoon on 2/12 he became more anxious and agitated. He left AMA at 1821. He returned within an hour to the ER having passed large volume dk bloody stool and vomiting large volume of blood. PCCM re-admitted.  Patient is currently intubated on ventilator support. MV:  8 L/min Temp (24hrs), Avg:97.2 F (36.2 C), Min:93.5 F (34.2 C), Max:99.4 F (37.4 C)  Patient reported a 14-23 lb weight loss on admission. Per weight history patient has had a 25% weight loss over the past year however patient does not appear malnourished upon physical exam.   Nutrition Focused Physical Exam:  Subcutaneous Fat:  Orbital Region: WNL Upper Arm Region: WNL Thoracic and Lumbar Region: N/A  Muscle:  Temple Region: WNL Clavicle Bone Region: WNL Clavicle and Acromion Bone Region: WNL Scapular Bone Region: N/A Dorsal Hand:  N/A Patellar Region: WNL Anterior Thigh Region: WNL Posterior Calf Region: N/A  Edema: none present  Height: Ht Readings from Last 1 Encounters:  04/26/13 5' 10.08" (1.78 m)    Weight: Wt Readings from Last 1 Encounters:  04/26/13 152 lb 12.5 oz (69.3 kg)    Ideal Body Weight: 166 lb  % Ideal Body Weight: 92%  Wt Readings from Last 10 Encounters:  04/26/13 152 lb 12.5 oz (69.3 kg)  04/26/13 152 lb 12.5 oz (69.3 kg)  04/26/13 152 lb 12.5 oz (69.3 kg)  04/25/13 150 lb (68.04 kg)  03/26/12 200 lb (90.719 kg)    Usual Body Weight: 166-175 lb  % Usual Body Weight: 95%  BMI:  Body mass index is 21.87 kg/(m^2).  Estimated Nutritional Needs: Kcal: 1745  Protein: 100-115 grams Fluid: >1.7 L  Skin: closed incision-abdomen  Diet Order: NPO  EDUCATION NEEDS: -Education not appropriate at this time   Intake/Output Summary (Last 24 hours) at 04/26/13 1033 Last data filed at 04/26/13 1000  Gross per 24 hour  Intake 9746.65 ml  Output   1625 ml  Net 8121.65 ml    Last BM: 2/13  Labs:   Recent Labs Lab 04/25/13 1040 04/25/13 2105 04/26/13 0535 04/26/13 0618 04/26/13 0755  NA 140 142 135* 142 142  K 5.0 3.6* 4.1 5.4* 6.1*  CL 108 106 101  --  115*  CO2 22  --  25  --  19  BUN 38* 39* 7  --  38*  CREATININE 0.69 1.00 0.88  --  1.02  CALCIUM 8.5  --  8.3*  --  6.6*  MG 1.9  --  1.5  --   --   PHOS 2.4  --  2.9  --   --   GLUCOSE 109* 147* 140*  --  221*    CBG (last 3)   Recent Labs  04/25/13 2358 04/26/13 0358 04/26/13 0818  GLUCAP 139* 330* 193*    Scheduled Meds: . antiseptic oral rinse  15 mL Mouth Rinse QID  . chlorhexidine  15 mL Mouth/Throat BID  . insulin aspart  2-6 Units Subcutaneous 6 times per day  . LORazepam  0.5 mg Intravenous Q6H  . sodium polystyrene  60 g Rectal Once    Continuous Infusions: . sodium chloride 100 mL/hr at 04/26/13 1000  . dexmedetomidine 0.4 mcg/kg/hr (04/26/13 1000)  . fentaNYL infusion INTRAVENOUS 50  mcg/hr (04/26/13 1000)  . norepinephrine (LEVOPHED) Adult infusion 20.053 mcg/min (04/26/13 1000)  . octreotide (SANDOSTATIN) infusion 25 mcg/hr (04/26/13 1000)  . pantoprozole (PROTONIX) infusion 8 mg/hr (04/26/13 1000)  . phenylephrine (NEO-SYNEPHRINE) Adult infusion Stopped (04/26/13 0813)    Past Medical History  Diagnosis Date  . Myocardial infarction     2008    Past Surgical History  Procedure Laterality Date  . Splenectomy    . Coronary angioplasty with stent placement      Marlane MingleAshley Bower, Dietetic Intern Pager: [phone]  I agree with student dietitian note; appropriate revisions have been made.  Joaquin CourtsKimberly Harris, RD, LDN, CNSC Pager# [phone] After Hours Pager# [phone]

## 2013-04-26 NOTE — Transfer of Care (Signed)
Immediate Anesthesia Transfer of Care Note  Patient: Tyrone [name]  Procedure(s) Performed: Procedure(s): EXPLORATORY LAPAROTOMY, OVERSEW OF GASTRIC AV MALFORMATION, REPAIR OF GASTROTOMY (N/A)  Patient Location: ICU 2109  Anesthesia Type:General  Level of Consciousness: sedated  Airway & Oxygen Therapy: Patient remains intubated per anesthesia plan and Patient placed on Ventilator (see vital sign flow sheet for setting)  Post-op Assessment: Post -op Vital signs reviewed and stable and report given to Blanchard, RN  Post vital signs: Reviewed and stable  Complications: No apparent anesthesia complications

## 2013-04-26 NOTE — Procedures (Signed)
Intubation Procedure Note Tizoc Oriley [id number] [birth year]  Procedure: Intubation Indications: Respiratory insufficiency  Procedure Details Consent: Risks of procedure as well as the alternatives and risks of each were explained to the (patient/caregiver).  Consent for procedure obtained. Time Out: Verified patient identification, verified procedure, site/side was marked, verified correct patient position, special equipment/implants available, medications/allergies/relevent history reviewed, required imaging and test results available.  Performed  Maximum sterile technique was used including antiseptics, cap, gloves, hand hygiene and mask.  MAC 3 glide scope    Evaluation Hemodynamic Status: BP stable throughout; O2 sats: stable throughout Patient's Current Condition: stable Complications: No apparent complications Patient did tolerate procedure well. Chest X-ray ordered to verify placement.  CXR: pending.   BABCOCK,PETE 04/26/2013  Luisa Hart WrightMD

## 2013-04-26 NOTE — Anesthesia Preprocedure Evaluation (Signed)
Anesthesia Evaluation  Patient identified by MRN, date of birth, ID band Patient unresponsive    Reviewed: Unable to perform ROS - Chart review only  Airway       Dental   Pulmonary COPDCurrent Smoker,          Cardiovascular + CAD and + Past MI     Neuro/Psych    GI/Hepatic   Endo/Other    Renal/GU      Musculoskeletal   Abdominal   Peds  Hematology  (+) anemia ,   Anesthesia Other Findings Gastric bleed  Reproductive/Obstetrics                           Anesthesia Physical Anesthesia Plan  ASA: III  Anesthesia Plan: General   Post-op Pain Management:    Induction: Intravenous  Airway Management Planned:   Additional Equipment:   Intra-op Plan:   Post-operative Plan: Post-operative intubation/ventilation  Informed Consent:   Plan Discussed with:   Anesthesia Plan Comments:         Anesthesia Quick Evaluation

## 2013-04-26 NOTE — Procedures (Signed)
Central Venous Catheter Insertion Procedure Note Antron Glaviano [id number] [birth year]  Procedure: Insertion of Central Venous Catheter Indications: Assessment of intravascular volume, Drug and/or fluid administration and Frequent blood sampling  Procedure Details Consent: Unable to obtain consent because of emergent medical necessity. Time Out: Verified patient identification, verified procedure, site/side was marked, verified correct patient position, special equipment/implants available, medications/allergies/relevent history reviewed, required imaging and test results available.  Performed REAL TIME [place] USED TO ID AND CANNULATE THE VESSEL  Maximum sterile technique was used including antiseptics, cap, gloves, gown, hand hygiene, mask and sheet. Skin prep: Chlorhexidine; local anesthetic administered A antimicrobial bonded/coated triple lumen catheter was placed in the right internal jugular vein using the Seldinger technique.  Evaluation Blood flow good Complications: No apparent complications Patient did tolerate procedure well. Chest X-ray ordered to verify placement.  CXR: pending.  BABCOCK,PETE 04/26/2013, 1:20 AM  I was present for this procedure. Shan Levans

## 2013-04-26 NOTE — Consult Note (Signed)
Reason for Consult:Upper GI Bleeding uncontrolled Referring Physician: [name] is an 58 y.o. male.  HPI: Patient has been having problems with UGI bleeding for at least the past 6 days, seen originally in [place], signed out multiple times AMA, just recently endoscoped by Dr. Evette Cristal who was able to identify bleeding from the greater curvature of the stomach, but not able to control it in any manner because of a large clot on the greater curvature.  May have a history of a gastric AVM recently seen , but not able to substantiate that with the patient because he is intubated and sedated because of this process. The patient continues to be hypotensive in spite of multiple units of blood and other blood products.  Will keep ahead 4U PRBCs and be ready for platelets also.  I have attempted to contact the patient's daughter in [place] without success.  I used thenumber th at was given to me by the nurse from the patient's chart.  Past Medical History  Diagnosis Date  . Myocardial infarction     2008    Past Surgical History  Procedure Laterality Date  . Splenectomy    . Coronary angioplasty with stent placement      History reviewed. No pertinent family history.  Social History:  reports that he has been smoking Cigarettes.  He has a 40 pack-year smoking history. He does not have any smokeless tobacco history on file. He reports that he does not drink alcohol or use illicit drugs.  Allergies:  Allergies  Allergen Reactions  . Nitroglycerin Nausea And Vomiting  . Toradol [Ketorolac Tromethamine] Itching  . Ultram [Tramadol] Itching    Medications:  I have reviewed the patient's current medications. Prior to Admission:  Prescriptions prior to admission  Medication Sig Dispense Refill  . ALPRAZolam (XANAX) 1 MG tablet Take 1 mg by mouth 3 (three) times daily.       Marland Kitchen HYDROcodone-acetaminophen (NORCO) 10-325 MG per tablet Take 1-2 tablets by mouth every 6 (six) hours as needed  for moderate pain.        Results for orders placed during the hospital encounter of 04/25/13 (from the past 48 hour(s))  CBC     Status: Abnormal   Collection Time    04/25/13  8:50 PM      Result Value Ref Range   WBC 17.8 (*) 4.0 - 10.5 K/uL   RBC 2.33 (*) 4.22 - 5.81 MIL/uL   Hemoglobin 7.9 (*) 13.0 - 17.0 g/dL   HCT 21.2 (*) 24.8 - 25.0 %   MCV 95.3  78.0 - 100.0 fL   MCH 33.9  26.0 - 34.0 pg   MCHC 35.6  30.0 - 36.0 g/dL   RDW 03.7  04.8 - 88.9 %   Platelets 140 (*) 150 - 400 K/uL  CBC     Status: Abnormal   Collection Time    04/25/13  8:50 PM      Result Value Ref Range   WBC 18.2 (*) 4.0 - 10.5 K/uL   RBC 2.36 (*) 4.22 - 5.81 MIL/uL   Hemoglobin 7.8 (*) 13.0 - 17.0 g/dL   HCT 16.9 (*) 45.0 - 38.8 %   MCV 95.8  78.0 - 100.0 fL   MCH 33.1  26.0 - 34.0 pg   MCHC 34.5  30.0 - 36.0 g/dL   RDW 82.8  00.3 - 49.1 %   Platelets 147 (*) 150 - 400 K/uL  APTT     Status:  None   Collection Time    04/25/13  8:50 PM      Result Value Ref Range   aPTT 30  24 - 37 seconds  PROTIME-INR     Status: None   Collection Time    04/25/13  8:50 PM      Result Value Ref Range   Prothrombin Time 13.9  11.6 - 15.2 seconds   INR 1.09  0.00 - 1.49  POCT I-STAT, CHEM 8     Status: Abnormal   Collection Time    04/25/13  9:05 PM      Result Value Ref Range   Sodium 142  137 - 147 mEq/L   Potassium 3.6 (*) 3.7 - 5.3 mEq/L   Chloride 106  96 - 112 mEq/L   BUN 39 (*) 6 - 23 mg/dL   Creatinine, Ser 1.611.00  0.50 - 1.35 mg/dL   Glucose, Bld 096147 (*) 70 - 99 mg/dL   Calcium, Ion 0.451.28 (*) 1.12 - 1.23 mmol/L   TCO2 20  0 - 100 mmol/L   Hemoglobin 7.8 (*) 13.0 - 17.0 g/dL   HCT 40.923.0 (*) 81.139.0 - 91.452.0 %  MRSA PCR SCREENING     Status: None   Collection Time    04/25/13 11:35 PM      Result Value Ref Range   MRSA by PCR NEGATIVE  NEGATIVE   Comment:            The GeneXpert MRSA Assay (FDA     approved for NASAL specimens     only), is one component of a     comprehensive MRSA colonization       surveillance program. It is not     intended to diagnose MRSA     infection nor to guide or     monitor treatment for     MRSA infections.  GLUCOSE, CAPILLARY     Status: Abnormal   Collection Time    04/25/13 11:58 PM      Result Value Ref Range   Glucose-Capillary 139 (*) 70 - 99 mg/dL   Comment 1 Documented in Chart     Comment 2 Notify RN    CBC     Status: Abnormal   Collection Time    04/26/13 12:14 AM      Result Value Ref Range   WBC 18.5 (*) 4.0 - 10.5 K/uL   RBC 2.10 (*) 4.22 - 5.81 MIL/uL   Hemoglobin 6.7 (*) 13.0 - 17.0 g/dL   Comment: REPEATED TO VERIFY     CRITICAL RESULT CALLED TO, READ BACK BY AND VERIFIED WITH:     M TOLER,RN [phone] WILDERK   HCT 19.2 (*) 39.0 - 52.0 %   MCV 91.4  78.0 - 100.0 fL   MCH 31.9  26.0 - 34.0 pg   MCHC 34.9  30.0 - 36.0 g/dL   RDW 21.316.5 (*) 08.611.5 - 57.815.5 %   Platelets 80 (*) 150 - 400 K/uL   Comment: SPECIMEN CHECKED FOR CLOTS     REPEATED TO VERIFY     PLATELET COUNT CONFIRMED BY SMEAR  PREPARE RBC (CROSSMATCH)     Status: None   Collection Time    04/26/13  1:00 AM      Result Value Ref Range   Order Confirmation ORDER PROCESSED BY BLOOD BANK    POCT I-STAT 3, BLOOD GAS (G3+)     Status: Abnormal   Collection Time    04/26/13  2:22  AM      Result Value Ref Range   pH, Arterial 7.217 (*) 7.350 - 7.450   pCO2 arterial 42.4  35.0 - 45.0 mmHg   pO2, Arterial 521.0 (*) 80.0 - 100.0 mmHg   Bicarbonate 17.4 (*) 20.0 - 24.0 mEq/L   TCO2 19  0 - 100 mmol/L   O2 Saturation 100.0     Acid-base deficit 10.0 (*) 0.0 - 2.0 mmol/L   Patient temperature 97.5 F     Collection site RADIAL, ALLEN'S TEST ACCEPTABLE     Drawn by RT     Sample type ARTERIAL      Dg Chest Port 1 View  04/26/2013   CLINICAL DATA:  Central venous line placement  EXAM: PORTABLE CHEST - 1 VIEW  COMPARISON:  None.  FINDINGS: The heart size and mediastinal contours are within normal limits. Endotracheal tube is identified distal tip 4.9 cm from carina. A  right jugular central venous line is identified distal tip in superior vena cava. A nasogastric tube is identified with distal tip not included on film but is at least in stomach. There is no focal infiltrate, pulmonary edema, or pleural effusion. There is no pneumothorax. The visualized skeletal structures are unremarkable.  IMPRESSION: Right jugular central venous line distal tip in superior vena cava. There is no pneumothorax.   Electronically Signed   By: Sherian Rein M.D.   On: 04/26/2013 01:54    ROS Blood pressure 70/39, pulse 92, temperature 96.7 F (35.9 C), temperature source Core (Comment), resp. rate 24, height 5' 10.08" (1.78 m), weight 69.3 kg (152 lb 12.5 oz), SpO2 100.00%. Physical Exam  Constitutional: He appears well-developed. He is intubated.  HENT:  Head: Normocephalic and atraumatic.  Eyes: Conjunctivae are normal. Pupils are equal, round, and reactive to light.  Cardiovascular: Regular rhythm.  Tachycardia present.   Respiratory: Breath sounds normal. He is intubated.  GI: Soft. Normal appearance. Bowel sounds are increased. There is tenderness.    Skin: Skin is intact. He is not diaphoretic. There is pallor.  Lots of tattoos    Assessment/Plan: Continued UGI bleeding with hypotension.  Will take to the OR stat for Exploratory lap and control.  Cherylynn Ridges 04/26/2013, 4:25 AM

## 2013-04-26 NOTE — Progress Notes (Signed)
The patient is now back from surgery. The operative report is not available, but the patient is stable on pressors, with no blood draining out his NG tube, still intubated, hemoglobin up at 11.8.   We will await the operative findings and be on standby in the event that we can be of further assistance in this patient's care.  Florencia Reasons, M.D. [phone]

## 2013-04-26 NOTE — Progress Notes (Signed)
RR increased per MD order after ABG results

## 2013-04-26 NOTE — Anesthesia Postprocedure Evaluation (Signed)
  Anesthesia Post-op Note  Patient: Tyrone [name]  Procedure(s) Performed: Procedure(s): EXPLORATORY LAPAROTOMY, OVERSEW OF GASTRIC AV MALFORMATION, REPAIR OF GASTROTOMY (N/A)  Patient Location: SICU  Anesthesia Type:General  Level of Consciousness: sedated and Patient remains intubated per anesthesia plan  Airway and Oxygen Therapy: Patient remains intubated per anesthesia plan and Patient placed on Ventilator (see vital sign flow sheet for setting)  Post-op Pain: none  Post-op Assessment: Post-op Vital signs reviewed  Post-op Vital Signs: Reviewed  Complications: No apparent anesthesia complications

## 2013-04-27 ENCOUNTER — Inpatient Hospital Stay (HOSPITAL_COMMUNITY): Payer: Medicare Other

## 2013-04-27 LAB — BLOOD GAS, ARTERIAL
Acid-base deficit: 7.5 mmol/L — ABNORMAL HIGH (ref 0.0–2.0)
BICARBONATE: 16.4 meq/L — AB (ref 20.0–24.0)
Drawn by: 36496
FIO2: 0.4 %
MECHVT: 580 mL
O2 Saturation: 99.5 %
PCO2 ART: 27.6 mmHg — AB (ref 35.0–45.0)
PEEP/CPAP: 5 cmH2O
PO2 ART: 139 mmHg — AB (ref 80.0–100.0)
Patient temperature: 99.5
RATE: 18 resp/min
TCO2: 17.2 mmol/L (ref 0–100)
pH, Arterial: 7.395 (ref 7.350–7.450)

## 2013-04-27 LAB — CBC
HEMATOCRIT: 26.5 % — AB (ref 39.0–52.0)
HEMOGLOBIN: 9.3 g/dL — AB (ref 13.0–17.0)
MCH: 30.7 pg (ref 26.0–34.0)
MCHC: 35.1 g/dL (ref 30.0–36.0)
MCV: 87.5 fL (ref 78.0–100.0)
Platelets: 61 10*3/uL — ABNORMAL LOW (ref 150–400)
RBC: 3.03 MIL/uL — AB (ref 4.22–5.81)
RDW: 16 % — AB (ref 11.5–15.5)
WBC: 21.3 10*3/uL — ABNORMAL HIGH (ref 4.0–10.5)

## 2013-04-27 LAB — PHOSPHORUS: Phosphorus: 2.7 mg/dL (ref 2.3–4.6)

## 2013-04-27 LAB — POCT I-STAT 3, ART BLOOD GAS (G3+)
ACID-BASE DEFICIT: 7 mmol/L — AB (ref 0.0–2.0)
BICARBONATE: 18.5 meq/L — AB (ref 20.0–24.0)
O2 Saturation: 95 %
Patient temperature: 99.2
TCO2: 20 mmol/L (ref 0–100)
pCO2 arterial: 37.5 mmHg (ref 35.0–45.0)
pH, Arterial: 7.303 — ABNORMAL LOW (ref 7.350–7.450)
pO2, Arterial: 85 mmHg (ref 80.0–100.0)

## 2013-04-27 LAB — PREPARE FRESH FROZEN PLASMA
UNIT DIVISION: 0
Unit division: 0
Unit division: 0
Unit division: 0

## 2013-04-27 LAB — BASIC METABOLIC PANEL
BUN: 30 mg/dL — AB (ref 6–23)
CHLORIDE: 119 meq/L — AB (ref 96–112)
CO2: 20 meq/L (ref 19–32)
Calcium: 6.9 mg/dL — ABNORMAL LOW (ref 8.4–10.5)
Creatinine, Ser: 0.96 mg/dL (ref 0.50–1.35)
GFR, EST NON AFRICAN AMERICAN: 90 mL/min — AB (ref >90)
GLUCOSE: 145 mg/dL — AB (ref 70–99)
Potassium: 4.2 mEq/L (ref 3.7–5.3)
SODIUM: 147 meq/L (ref 137–147)

## 2013-04-27 LAB — GLUCOSE, CAPILLARY
Glucose-Capillary: 109 mg/dL — ABNORMAL HIGH (ref 70–99)
Glucose-Capillary: 119 mg/dL — ABNORMAL HIGH (ref 70–99)
Glucose-Capillary: 139 mg/dL — ABNORMAL HIGH (ref 70–99)
Glucose-Capillary: 140 mg/dL — ABNORMAL HIGH (ref 70–99)
Glucose-Capillary: 144 mg/dL — ABNORMAL HIGH (ref 70–99)

## 2013-04-27 LAB — PREPARE PLATELET PHERESIS: UNIT DIVISION: 0

## 2013-04-27 LAB — MAGNESIUM: MAGNESIUM: 1.6 mg/dL (ref 1.5–2.5)

## 2013-04-27 MED ORDER — ADULT MULTIVITAMIN W/MINERALS CH: 1.0000 | ORAL_TABLET | Freq: Every day | ORAL | Status: DC

## 2013-04-27 MED ORDER — LORAZEPAM 2 MG/ML IJ SOLN
2.0000 mg | INTRAMUSCULAR | Status: DC | PRN
  Administered 2013-04-27: 2 mg via INTRAVENOUS
  Filled 2013-04-27: qty 1

## 2013-04-27 MED ORDER — PANTOPRAZOLE SODIUM 40 MG IV SOLR
40.0000 mg | Freq: Two times a day (BID) | INTRAVENOUS | Status: DC
  Administered 2013-04-27 – 2013-05-01 (×10): 40 mg via INTRAVENOUS
  Filled 2013-04-27 (×13): qty 40

## 2013-04-27 MED ORDER — LORAZEPAM 2 MG/ML IJ SOLN
INTRAMUSCULAR | Status: AC
  Filled 2013-04-27: qty 1

## 2013-04-27 MED ORDER — CHLORDIAZEPOXIDE HCL 25 MG PO CAPS: 25.0000 mg | ORAL_CAPSULE | Freq: Four times a day (QID) | ORAL | Status: DC | PRN

## 2013-04-27 MED ORDER — CHLORDIAZEPOXIDE HCL 25 MG PO CAPS: 25.0000 mg | ORAL_CAPSULE | Freq: Three times a day (TID) | ORAL | Status: DC

## 2013-04-27 MED ORDER — THIAMINE HCL 100 MG/ML IJ SOLN
100.0000 mg | Freq: Once | INTRAMUSCULAR | Status: AC
  Administered 2013-04-27: 100 mg via INTRAMUSCULAR
  Filled 2013-04-27: qty 1

## 2013-04-27 MED ORDER — NICOTINE 21 MG/24HR TD PT24
21.0000 mg | MEDICATED_PATCH | TRANSDERMAL | Status: DC
  Administered 2013-04-27 – 2013-05-01 (×5): 21 mg via TRANSDERMAL
  Filled 2013-04-27 (×6): qty 1

## 2013-04-27 MED ORDER — VITAMIN B-1 100 MG PO TABS: 100.0000 mg | ORAL_TABLET | Freq: Every day | ORAL | Status: DC

## 2013-04-27 MED ORDER — LORAZEPAM 2 MG/ML IJ SOLN
2.0000 mg | Freq: Four times a day (QID) | INTRAMUSCULAR | Status: DC | PRN
  Administered 2013-04-27: 2 mg via INTRAVENOUS

## 2013-04-27 MED ORDER — ONDANSETRON 4 MG PO TBDP
4.0000 mg | ORAL_TABLET | Freq: Four times a day (QID) | ORAL | Status: DC | PRN
  Filled 2013-04-27: qty 1

## 2013-04-27 MED ORDER — LOPERAMIDE HCL 2 MG PO CAPS
2.0000 mg | ORAL_CAPSULE | ORAL | Status: DC | PRN
  Filled 2013-04-27: qty 2

## 2013-04-27 MED ORDER — FENTANYL CITRATE 0.05 MG/ML IJ SOLN
50.0000 ug | INTRAMUSCULAR | Status: DC | PRN
  Administered 2013-04-27: 50 ug via INTRAVENOUS
  Administered 2013-04-27: 100 ug via INTRAVENOUS
  Administered 2013-04-27: 50 ug via INTRAVENOUS
  Administered 2013-04-27 – 2013-04-28 (×5): 100 ug via INTRAVENOUS
  Filled 2013-04-27 (×7): qty 2

## 2013-04-27 MED ORDER — CHLORDIAZEPOXIDE HCL 25 MG PO CAPS: 25.0000 mg | ORAL_CAPSULE | Freq: Every day | ORAL | Status: DC

## 2013-04-27 MED ORDER — CHLORDIAZEPOXIDE HCL 25 MG PO CAPS: 25.0000 mg | ORAL_CAPSULE | Freq: Four times a day (QID) | ORAL | Status: DC

## 2013-04-27 MED ORDER — HYDROXYZINE HCL 25 MG PO TABS
25.0000 mg | ORAL_TABLET | Freq: Four times a day (QID) | ORAL | Status: DC | PRN
  Filled 2013-04-27: qty 1

## 2013-04-27 MED ORDER — CHLORDIAZEPOXIDE HCL 25 MG PO CAPS: 25.0000 mg | ORAL_CAPSULE | ORAL | Status: DC

## 2013-04-27 MED ORDER — MAGNESIUM SULFATE 40 MG/ML IJ SOLN
2.0000 g | Freq: Once | INTRAMUSCULAR | Status: AC
  Administered 2013-04-27: 2 g via INTRAVENOUS
  Filled 2013-04-27: qty 50

## 2013-04-27 NOTE — Consult Note (Signed)
  S-Called by  TTS with request from Dr Katrinka Blazing Critical Care Physician at [phone] regarding this pt at [facility] who is S/P emergency surgery for a GI Bleed who has acted erratically since he presented for c/o vomiting blood 2 days earlier and signing out AMA only to return in 2 hours.Peer DR Katrinka Blazing this pt's saga began 6 days ago with the same scenario at a Hospital in [place] where he left AMA/presented to [facility]  And repeated the same behaviors before he showed up at the [facility] ED. Dr Katrinka Blazing says documentation for his hx is sketchy esp with regards to his alcohol and benzodiazapene use/abuse and ?hx of bipolar D/O. At first Dr Katrinka Blazing felt pt was responnding to Fentanyl and Ativan orders but he called back to seek IVC papers we discussed earlier because pt has refused to act in his own best interest-insiting on drinking water and calling someone to come and get him.He states pt has not been psychotic.He is not a good candidate for telepsych at this timwe given his state of agitation.Pt has hx of COPD and CAD with CABG  O LABS--He has an infection by his WBC.No toxicology labs done.  A-Pt is a danger to himself ?ETOH/Benzo withdrawal.? Mental Illness (Bipolar) ?Both  P-Contact TTS /AXCT Harlin Rain who will assist Dr Katrinka Blazing with IVC papers.Recommend using Librium Protocol rather than Ativan unless contraindicated.Recommend considering  Haldol due to heart history if Librium does not provide adequate sedation until patient cam be seen. Hold any telepsych until pt not agitated to avoid possible aggravation of situation.

## 2013-04-27 NOTE — Procedures (Signed)
Extubation Procedure Note  Patient Details:   Name: Tyrone [name] DOB: 1956/02/19 MRN: [id number]   Airway Documentation:     Evaluation  O2 sats: stable throughout and currently acceptable Complications: No apparent complications Patient did tolerate procedure well. Bilateral Breath Sounds: Clear;Rhonchi Suctioning: Airway Yes  Prior to extubation:  Pt suctioned orally and via ETT.  Post-extubation:  Pt able to cough to produce sputum, state his name, no stridor noted.  Antoine Poche 04/27/2013, 1:19 PM

## 2013-04-27 NOTE — Progress Notes (Signed)
eLink Physician-Brief Progress Note Patient Name: Tyrone [name] DOB: April 19, 1955 MRN: [id number]  Date of Service  04/27/2013   HPI/Events of Note   Patient POD1 from surgery for UGI bleed.  He has a listed history of bipolar DO and left the hospital AMA once prior during this hospital stay.  This evening patient pulled out CVC and again threatening to leave AMA.  He is not listening to our reasoning for needing to stay in the hospital.  During the day today patient intermittently confused.  I do not feel patient has the mental capacity to make appropriate decisions for his well being.   eICU Interventions  Psychiatry consult Ativan prn as I am concerned patient may be in withdrawal state.   Intervention Category Minor Interventions: Agitation / anxiety - evaluation and management  Henry Russel, P 04/27/2013, 8:02 PM

## 2013-04-27 NOTE — BH Assessment (Signed)
Received call from Dr. Henry Russel requesting assistance with IVC paperwork. Dr. Katrinka Blazing said Dr. Overton Mam would be completing paperwork. Filled out demographic information on Affidavit and Petition and Examination and Recommendation forms. The Affidavit and Petition needs to be notarized. The clinical section on both forms needs to explain what safety issue require involuntary commitment. Once the forms are completed they need to be sent to the [facility]. Call the magistrate at [phone] and then fax the forms to [phone]. Please call [phone] with any questions.   Harlin Rain Ria Comment, [facility] Triage Specialist

## 2013-04-27 NOTE — Progress Notes (Signed)
Patient is alert and is soon to be extubated, off pressors.  Drainage from oral gastric tube is clear and bilious, not at all bloody. Hemoglobin is stable.  Operative findings noted. 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  In any event, I don't think there is any need for endoscopic followup.  I would keep the patient on PPI therapy while in house to help prophylax against stress gastritis, and you might consider a week or 2 of PPI therapy post discharge, but unless the patient has problems with ongoing dyspeptic symptoms, I don't think he really needs long-term PPI therapy, inasmuch as it does not sound like this was a peptic process that led to his bleeding.  We will sign off at this time, but would welcome your call if we can be of further assistance in this patient's care.  Florencia Reasons, M.D. [phone]

## 2013-04-27 NOTE — Progress Notes (Signed)
eLink Physician-Brief Progress Note Patient Name: Tyrone [name] DOB: 01-09-1956 MRN: [id number]  Date of Service  04/27/2013   HPI/Events of Note  Patient has continued to be noncompliant and threaten to leave.  I have spoke to behavioral health.  Will stop ativan and put on librium protocol per their recommendations.  Also will commit patient as he clearly does not understand his present circumstances and he is not making rational decisions.    eICU Interventions  Stop ativan Librium protocol Behavioral health to assist with commitment.   Intervention Category Minor Interventions: Agitation / anxiety - evaluation and management  Henry Russel, P 04/27/2013, 10:06 PM
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
## 2013-04-27 NOTE — Progress Notes (Signed)
eLink Physician-Brief Progress Note Patient Name: Tyrone [name] DOB: [birth year] MRN: [id number]  Date of Service  04/27/2013   HPI/Events of Note   Low magnesium 1.6  eICU Interventions  2 grams magnesium sulfate ordered      Henry Russel, P 04/27/2013, 5:16 AM

## 2013-04-28 DIAGNOSIS — F411 Generalized anxiety disorder: Secondary | ICD-10-CM

## 2013-04-28 DIAGNOSIS — F319 Bipolar disorder, unspecified: Secondary | ICD-10-CM

## 2013-04-28 LAB — GLUCOSE, CAPILLARY
Glucose-Capillary: 123 mg/dL — ABNORMAL HIGH (ref 70–99)
Glucose-Capillary: 128 mg/dL — ABNORMAL HIGH (ref 70–99)
Glucose-Capillary: 135 mg/dL — ABNORMAL HIGH (ref 70–99)
Glucose-Capillary: 140 mg/dL — ABNORMAL HIGH (ref 70–99)

## 2013-04-28 LAB — CBC
HEMATOCRIT: 26.8 % — AB (ref 39.0–52.0)
HEMOGLOBIN: 9.1 g/dL — AB (ref 13.0–17.0)
MCH: 31 pg (ref 26.0–34.0)
MCHC: 34 g/dL (ref 30.0–36.0)
MCV: 91.2 fL (ref 78.0–100.0)
Platelets: 73 10*3/uL — ABNORMAL LOW (ref 150–400)
RBC: 2.94 MIL/uL — AB (ref 4.22–5.81)
RDW: 16.4 % — ABNORMAL HIGH (ref 11.5–15.5)
WBC: 19.5 10*3/uL — ABNORMAL HIGH (ref 4.0–10.5)

## 2013-04-28 LAB — BASIC METABOLIC PANEL
BUN: 24 mg/dL — AB (ref 6–23)
CO2: 23 meq/L (ref 19–32)
CREATININE: 0.82 mg/dL (ref 0.50–1.35)
Calcium: 7.8 mg/dL — ABNORMAL LOW (ref 8.4–10.5)
Chloride: 117 mEq/L — ABNORMAL HIGH (ref 96–112)
GFR calc Af Amer: >90 mL/min (ref >90)
Glucose, Bld: 135 mg/dL — ABNORMAL HIGH (ref 70–99)
Potassium: 4 mEq/L (ref 3.7–5.3)
SODIUM: 148 meq/L — AB (ref 137–147)

## 2013-04-28 MED ORDER — LORAZEPAM 2 MG/ML IJ SOLN
1.0000 mg | INTRAMUSCULAR | Status: DC
  Administered 2013-04-28 – 2013-05-02 (×22): 1 mg via INTRAVENOUS
  Filled 2013-04-28 (×23): qty 1

## 2013-04-28 MED ORDER — HYDROMORPHONE HCL PF 1 MG/ML IJ SOLN
0.5000 mg | INTRAMUSCULAR | Status: DC | PRN
  Administered 2013-04-28 (×2): 0.5 mg via INTRAVENOUS
  Filled 2013-04-28 (×2): qty 1

## 2013-04-28 MED ORDER — THIAMINE HCL 100 MG/ML IJ SOLN
100.0000 mg | Freq: Every day | INTRAMUSCULAR | Status: DC
  Administered 2013-04-28 – 2013-05-01 (×3): 100 mg via INTRAVENOUS
  Filled 2013-04-28 (×4): qty 1

## 2013-04-28 MED ORDER — THIAMINE HCL 100 MG/ML IJ SOLN
100.0000 mg | Freq: Every day | INTRAMUSCULAR | Status: DC
  Administered 2013-04-28 – 2013-04-29 (×2): 100 mg via INTRAVENOUS
  Filled 2013-04-28 (×2): qty 1

## 2013-04-28 MED ORDER — DEXTROSE 5 % IV SOLN
INTRAVENOUS | Status: DC
  Administered 2013-04-28 – 2013-04-30 (×3): via INTRAVENOUS

## 2013-04-28 MED ORDER — LORAZEPAM 2 MG/ML IJ SOLN
1.0000 mg | Freq: Four times a day (QID) | INTRAMUSCULAR | Status: DC
  Administered 2013-04-28 (×2): 1 mg via INTRAVENOUS
  Filled 2013-04-28 (×2): qty 1

## 2013-04-28 MED ORDER — PNEUMOCOCCAL VAC POLYVALENT 25 MCG/0.5ML IJ INJ
0.5000 mL | INJECTION | INTRAMUSCULAR | Status: DC
  Filled 2013-04-28: qty 0.5

## 2013-04-28 MED ORDER — OLANZAPINE 5 MG PO TBDP
5.0000 mg | ORAL_TABLET | Freq: Every day | ORAL | Status: DC
  Administered 2013-04-28 – 2013-05-01 (×4): 5 mg via ORAL
  Filled 2013-04-28 (×5): qty 1

## 2013-04-28 MED ORDER — HYDROMORPHONE HCL PF 1 MG/ML IJ SOLN
1.0000 mg | INTRAMUSCULAR | Status: DC | PRN
  Administered 2013-04-28 – 2013-05-02 (×18): 1 mg via INTRAVENOUS
  Filled 2013-04-28 (×19): qty 1

## 2013-04-28 NOTE — Progress Notes (Signed)
2 Days Post-Op  Subjective: Pt asking for ice chips/water. No acute changes overnight  Objective: Vital signs in last 24 hours: Temp:  [94.8 F (34.9 C)-98.9 F (37.2 C)] 97.8 F (36.6 C) (02/15 0400) Pulse Rate:  [68-86] 78 (02/15 0900) Resp:  [9-21] 16 (02/15 0900) BP: (91-152)/(56-104) 142/71 mmHg (02/15 0900) SpO2:  [91 %-100 %] 95 % (02/15 0900) FiO2 (%):  [40 %] 40 % (02/14 1200) Last BM Date: 04/25/13  Intake/Output from previous day: 02/14 0701 - 02/15 0700 In: 1158.1 [I.V.:1158.1] Out: 1555 [Urine:1455; Stool:100] Intake/Output this shift: Total I/O In: -  Out: 110 [Urine:110]  General appearance: alert and cooperative GI: soft, approp ttp, ND, wound c/d/i  Lab Results:   Recent Labs  04/27/13 0325 04/28/13 0605  WBC 21.3* 19.5*  HGB 9.3* 9.1*  HCT 26.5* 26.8*  PLT 61* 73*   BMET  Recent Labs  04/27/13 0325 04/28/13 0605  NA 147 148*  K 4.2 4.0  CL 119* 117*  CO2 20 23  GLUCOSE 145* 135*  BUN 30* 24*  CREATININE 0.96 0.82  CALCIUM 6.9* 7.8*   PT/INR  Recent Labs  04/25/13 1242 04/25/13 2050  LABPROT 14.5 13.9  INR 1.15 1.09   ABG  Recent Labs  04/27/13 0412 04/27/13 1021  PHART 7.395 7.303*  HCO3 16.4* 18.5*    Studies/Results: Dg Chest Port 1 View  04/27/2013   CLINICAL DATA:  ET tube.  EXAM: PORTABLE CHEST - 1 VIEW  COMPARISON:  Chest radiograph 04/26/2013.  FINDINGS: NG tube courses inferior to the diaphragm. ET tube terminates 5.8 cm superior to the carina. Right IJ central venous catheter tip projects over the superior cavoatrial junction.  Low lung volumes. Stable cardiac and mediastinal contours. Mild pulmonary vascular redistribution. No definite large consolidative pulmonary opacity. No pleural effusion or pneumothorax.  IMPRESSION: Stable support apparatus.  Mild pulmonary vascular redistribution.   Electronically Signed   By: Annia Belt M.D.   On: 04/27/2013 08:19    Anti-infectives: Anti-infectives   Start      Dose/Rate Route Frequency Ordered Stop   04/26/13 0600  [MAR Hold]  cefOXitin (MEFOXIN) 2 g in dextrose 5 % 50 mL IVPB     (On MAR Hold since 04/26/13 0512)   2 g 100 mL/hr over 30 Minutes Intravenous On call to O.R. 04/26/13 0425 04/26/13 0533      Assessment/Plan: s/p Procedure(s): EXPLORATORY LAPAROTOMY, OVERSEW OF GASTRIC AV MALFORMATION, REPAIR OF GASTROTOMY (N/A) Mobilize Con't NPO at this time OK for SDU   LOS: 3 days    Marigene Ehlers., [facility] 04/28/2013
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
## 2013-04-28 NOTE — Progress Notes (Signed)
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  SIGNIFICANT EVENTS / STUDIES:  2/12  Signed out AMA, re-admitted an hour later 2/12  Transfused 4 units of PRBC 2/13  Endoscopy >>> active bleeding at greater curvature 2/13  OR >>> Exploratory lap, oversew of gastric AV malformation, repair of gastrotomy  LINES / TUBES:  OETT 2/13 >>> 2/14 NGT 2/13 >>> 2/14 RIJ TLC 2/13 >>> 2/14 Foley 2/13 >>> 2/15 R rad a-line ??? >>> 2/14  CULTURES:   ANTIBIOTICS:   INTERVAL HISTORY:  Intermittent agitation.  Seen by psychiatry.  Reports some abdominal pain with coughing.  Thirsty.   VITAL SIGNS: Temp:  [94.8 F (34.9 C)-99.2 F (37.3 C)] 97.8 F (36.6 C) (02/15 0400) Pulse Rate:  [68-86] 86 (02/15 0600) Resp:  [9-21] 9 (02/15 0600) BP: (91-152)/(56-104) 135/77 mmHg (02/15 0600) SpO2:  [91 %-100 %] 99 % (02/15 0600) FiO2 (%):  [40 %] 40 % (02/14 1200)  HEMODYNAMICS: CVP:  [13 mmHg] 13 mmHg  VENTILATOR SETTINGS: Vent Mode:  [-] PSV;CPAP FiO2 (%):  [40 %] 40 % PEEP:  [5 cmH20] 5 cmH20 Pressure Support:  [5 cmH20] 5 cmH20  INTAKE / OUTPUT: Intake/Output     02/14 0701 - 02/15 0700 02/15 0701 - 02/16 0700   I.V. (mL/kg) 1158.1 (14.9)    Blood     IV Piggyback     Total Intake(mL/kg) 1158.1 (14.9)    Urine (mL/kg/hr) 1455 (0.8) 110 (0.7)   Stool 100 (0.1)    Blood     Total Output 1555 110   Net -397 -110         PHYSICAL EXAMINATION: General:  Resting in no distress Neuro:  Awake, alert, anxious HEENT:  PERRL Cardiovascular:  RRR, no m/r/g Lungs:  CTAB Abdomen:  Soft, mild;y tender, bowel sounds diminished, surgical dressing intact Musculoskeletal:  Moves all extremities, no edema Skin:   Intact  LABS:  CBC  Recent Labs Lab 04/26/13 1933 04/27/13 0325 04/28/13 0605  WBC 21.7* 21.3* 19.5*  HGB 9.6* 9.3* 9.1*  HCT 26.9* 26.5* 26.8*  PLT 62* 61* 73*   Coag's  Recent Labs Lab 04/25/13 1040 04/25/13 1242 04/25/13 2050  APTT 34 34 30  INR 1.13 1.15 1.09   BMET  Recent Labs Lab 04/26/13 1933 04/27/13 0325 04/28/13 0605  NA 147 147 148*  K 4.2 4.2 4.0  CL 119* 119* 117*  CO2 19 20 23   BUN 36* 30* 24*  CREATININE 1.12 0.96 0.82  GLUCOSE 123* 145* 135*   Electrolytes  Recent Labs Lab 04/25/13 1040 04/26/13 0535  04/26/13 1933 04/27/13 0325 04/28/13 0605  CALCIUM 8.5 8.3*  < > 6.7* 6.9* 7.8*  MG 1.9 1.5  --   --  1.6  --   PHOS 2.4 2.9  --   --  2.7  --   < > = values in this interval not displayed. Sepsis Markers  Recent Labs Lab 04/25/13 1040  LATICACIDVEN 1.1  PROCALCITON <0.10   ABG  Recent Labs Lab 04/26/13 0820 04/27/13 0412 04/27/13 1021  PHART 7.288* 7.395 7.303*  PCO2ART 37.4 27.6* 37.5  PO2ART 139.0* 139.0* 85.0   Liver Enzymes  Recent Labs Lab 04/25/13 1040  AST 27  ALT 21  ALKPHOS 54  BILITOT 0.4  ALBUMIN 2.6*   Cardiac Enzymes  Recent Labs Lab 04/25/13 1040  TROPONINI <0.30   Glucose  Recent Labs Lab 04/27/13 0315 04/27/13 0718 04/27/13 1129 04/27/13 1516 04/28/13 0002 04/28/13 0358  GLUCAP 139* 109* 119* 144* 135* 140*   CXR: None today  ASSESSMENT / PLAN:  PULMONARY  A:  Acute respiratory failure in setting of hemorrhagic shock / encephalopathy, resolved COPD without exacerbation P:  Goal SpO2>92 Supplemental oxygen PRN Albuterol PRN Nicotine patch  CARDIOVASCULAR  A:  Hemorrhagic shock, resolved P:  Goal MAP > 65  RENAL  A: AKI Hypernatremia P:  Trend BMP Change IVF to D5W@100   GASTROINTESTINAL  A:  Gastric AVM s/p surgical repair Upper GI hemorrhage GI Px P:  GI / CCS following NPO Protonix BID  HEMATOLOGIC  A:  Acute blood loss anemia, stable VTE Px P:   Goal Hb>7 Trend CBC SCDs  INFECTIOUS  A:  No active issues P: No intervention required  ENDOCRINE A: Normoglycemic P:  No intervention required  NEUROLOGIC  A:  Bipolar  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  TRH to assume care 1/16.  PCCM will sign off.  I have personally obtained history, examined patient, evaluated and interpreted laboratory and imaging results, reviewed medical records, formulated assessment / plan and placed orders.  Lonia Farber, MD Pulmonary and Critical Care Medicine [facility] Pager: [phone]  04/28/2013, 8:56 AM

## 2013-04-28 NOTE — Progress Notes (Signed)
eLink Physician-Brief Progress Note Patient Name: Demorio Murie DOB: 1956-02-19 MRN: [id number]  Date of Service  04/28/2013   HPI/Events of Note   Mild aggitation. Not sedated by current ativan dose but RN afraid it is not adequate.    eICU Interventions   Increase ativan frequency to q4. Will increase further if needed.    Intervention Category Intermediate Interventions: Change in mental status - evaluation and management Minor Interventions: Agitation / anxiety - evaluation and management  Ziyon Cedotal R. 04/28/2013, 7:20 PM

## 2013-04-28 NOTE — Progress Notes (Signed)
Attempted to contact the patient's daughter twice during the night to give her an update on her father.  Was not able to complete the call (failed connection).

## 2013-04-29 ENCOUNTER — Encounter (HOSPITAL_COMMUNITY): Payer: Self-pay | Admitting: Gastroenterology

## 2013-04-29 LAB — TYPE AND SCREEN
ABO/RH(D): O POS
Antibody Screen: NEGATIVE
Unit division: 0
Unit division: 0
Unit division: 0
Unit division: 0
Unit division: 0
Unit division: 0
Unit division: 0
Unit division: 0
Unit division: 0
Unit division: 0
Unit division: 0
Unit division: 0

## 2013-04-29 MED ORDER — WHITE PETROLATUM GEL
Status: AC
  Administered 2013-04-29: 0.2
  Filled 2013-04-29: qty 5

## 2013-04-29 MED ORDER — HALOPERIDOL LACTATE 5 MG/ML IJ SOLN
5.0000 mg | Freq: Four times a day (QID) | INTRAMUSCULAR | Status: DC | PRN
  Administered 2013-04-29 – 2013-05-01 (×3): 5 mg via INTRAVENOUS
  Filled 2013-04-29 (×3): qty 1

## 2013-04-29 NOTE — Progress Notes (Signed)
[facility] TEAM 1 - Stepdown/ICU TEAM Progress Note  Tyrone [name] ZOX:[id number] DOB: December 27, 1955 DOA: 04/25/2013 PCP: No primary provider on file.  Admit HPI / Brief Narrative: 58 yo M transferred from [facility] for UGIB w/ transient hypotension. He was seen 6 days prior in [facility] for similar issue but signed out AMA. He presented to [facility] with chief complaint of right calf pain and had negative LEDS and was being discharged when he vomited blood and passed out. [place] and [facility] were on diversion therefore he was transferred to [facility].  He was placed on PPI drip, Octreotide Drip and GI was consulted. He has known non-compliance, TBI, and bipolar disorder.   SIGNIFICANT EVENTS / STUDIES:  2/12 Signed out AMA, re-admitted an hour later  2/12 Transfused 4 units of PRBC  2/13 Endoscopy >>> active bleeding at greater curvature  2/13 OR >>> Exploratory lap, oversew of gastric AV malformation, repair of gastrotomy OETT 2/13 >>> 2/14  NGT 2/13 >>> 2/14  RIJ TLC 2/13 >>> 2/14  Foley 2/13 >>> 2/15  R rad a-line ??? >>> 2/14  HPI/Subjective: Pt is somnolent.  Upon waking he denies any complaints.  He has required restraints this morning to protect medical equipment/provide for his safety.    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 rate 15, height 5' 10.08" (1.78 m), weight 77.6 kg (171 lb 1.2 oz), SpO2 98.00%.  Intake/Output Summary (Last 24 hours) at 04/29/13 1216 Last data filed at 04/29/13 1200  Gross per 24 hour  Intake   2110 ml  Output   2650 ml  Net   -540 ml   Exam: General: No acute respiratory distress at rest in bed  Lungs: Clear to auscultation bilaterally without wheezes or crackles Cardiovascular: Regular rate and rhythm without murmur gallop or rub  Abdomen: Nondistended, soft, midline staples w/o d/c or erythema, no rebound, no ascites Extremities: No significant cyanosis, clubbing, or edema bilateral lower extremities  Data Reviewed: Basic Metabolic Panel:  Recent Labs Lab 04/25/13 1040  04/26/13 0535 04/26/13 0618 04/26/13 0755 04/26/13 1933 04/27/13 0325 04/28/13 0605  NA 140  < > 135* 142 142 147 147 148*  K 5.0  < > 4.1 5.4* 6.1* 4.2 4.2 4.0  CL 108  < > 101  --  115* 119* 119* 117*  CO2 22  --  25  --  19 19 20  23  GLUCOSE 109*  < > 140*  --  221* 123* 145* 135*  BUN 38*  < > 7  --  38* 36* 30* 24*  CREATININE 0.69  < > 0.88  --  1.02 1.12 0.96 0.82  CALCIUM 8.5  --  8.3*  --  6.6* 6.7* 6.9* 7.8*  MG 1.9  --  1.5  --   --   --  1.6  --   PHOS 2.4  --  2.9  --   --   --  2.7  --   < > = values in this interval not displayed.  Liver Function Tests:  Recent Labs Lab 04/25/13 1040  AST 27  ALT 21  ALKPHOS 54  BILITOT 0.4  PROT 5.1*  ALBUMIN 2.6*   CBC:  Recent Labs Lab 04/26/13 0535 04/26/13 0618 04/26/13 0755 04/26/13 1933 04/27/13 0325 04/28/13 0605  WBC 10.0  --  11.2* 21.7* 21.3* 19.5*    NEUTROABS  --   --  6.9  --   --   --   HGB 12.5* 8.2* 11.8* 9.6* 9.3* 9.1*  HCT 34.5* 24.0* 33.2* 26.9* 26.5* 26.8*  MCV 85.2  --  86.7 86.2 87.5 91.2  PLT 181  --  22* 62* 61* 73*   CBG:  Recent Labs Lab 04/27/13 1516 04/28/13 0002 04/28/13 0358 04/28/13 1115 04/28/13 1515  GLUCAP 144* 135* 140* 128* 123*    Recent Results (from the past 240 hour(s))  MRSA PCR SCREENING     Status: None   Collection Time    04/25/13  8:40 AM      Result Value Ref Range Status   MRSA by PCR NEGATIVE  NEGATIVE Final   Comment:            The GeneXpert MRSA Assay (FDA     approved for NASAL specimens     only), is one component of a     comprehensive MRSA colonization     surveillance program. It is not     intended to diagnose MRSA     infection nor to guide or     monitor treatment for     MRSA infections.  MRSA PCR SCREENING     Status: None   Collection Time    04/25/13 11:35 PM      Result Value Ref Range Status   MRSA by PCR NEGATIVE  NEGATIVE Final   Comment:            The GeneXpert MRSA Assay (FDA     approved for NASAL specimens     only), is one component of a     comprehensive MRSA colonization     surveillance program. It is not     intended to diagnose MRSA     infection nor to guide or     monitor treatment for     MRSA infections.     Studies:  Recent x-ray studies have been reviewed in detail by the Attending Physician  Scheduled Meds:  Scheduled Meds: . antiseptic oral rinse  15 mL Mouth Rinse QID  . chlorhexidine  15 mL Mouth/Throat BID  . LORazepam  1 mg Intravenous Q4H  . nicotine  21 mg Transdermal Q24H  . OLANZapine zydis  5 mg Oral QHS  . pantoprazole (PROTONIX) IV  40 mg Intravenous Q12H  . pneumococcal 23 valent vaccine  0.5 mL Intramuscular Tomorrow-1000  . thiamine  100 mg Intravenous Daily  . thiamine  100 mg Intravenous  Daily    Time spent on care of this patient: 35 mins   [facility] Hills Endoscopy CenterMCCLUNG,Graeme Menees T  [facility] Office   [phone] Pager - Text Page per Loretha StaplerAmion as per below:  On-Call/Text Page:      [email or web address]      password [email or web address]  If 7PM-7AM, please contact night-coverage [email or web address] Password [email or web address] 04/29/2013, 12:16 PM   LOS: 4 days

## 2013-04-29 NOTE — Progress Notes (Signed)
Patient ID: Tyrone [name], male   DOB: 07/01/1955, 58 y.o.   MRN: [id number]  Subjective: Pt asking to have restraints taken off, sitter at bedside.  bm recorded.    Objective:  Vital signs:  Filed Vitals:   04/28/13 1912 04/28/13 2320 04/29/13 0358 04/29/13 0825  BP: 175/95 155/80 163/85 116/97  Pulse: 99 98 104   Temp: 98 F (36.7 C) 98.1 F (36.7 C) 97.8 F (36.6 C) 98.4 F (36.9 C)  TempSrc: Oral Oral Oral Oral  Resp: _0 Height:      Weight:      SpO2: 92% 98% 99% 97%    Last BM Date: 04/27/13  Intake/Output   Yesterday:  02/15 0701 - 02/16 0700 In: 2145.8 [I.V.:2145.8] Out: 1110 [Urine:660; Stool:450] This shift:  Total I/O In: 100 [I.V.:100] Out: 950 [Urine:950]   Physical Exam: General: Pt awake/alert/oriented x3.  Inappropriate.    Abdomen: Soft.  Mildly distended.  Appropriately tender. Midline incision-dressing removed, staples are intact, edges are approximated and without erythema.     Problem List:   Principal Problem:   GIB (gastrointestinal bleeding) Active Problems:   COPD exacerbation   Acute respiratory failure   Acute blood loss anemia   Hemorrhagic shock    Results:   Labs: Results for orders placed during the hospital encounter of 04/25/13 (from the past 48 hour(s))  POCT I-STAT 3, BLOOD GAS (G3+)     Status: Abnormal   Collection Time    04/27/13 10:21 AM      Result Value Ref Range   pH, Arterial 7.303 (*) 7.350 - 7.450   pCO2 arterial 37.5  35.0 - 45.0 mmHg   pO2, Arterial 85.0  80.0 - 100.0 mmHg   Bicarbonate 18.5 (*) 20.0 - 24.0 mEq/L   TCO2 20  0 - 100 mmol/L   O2 Saturation 95.0     Acid-base deficit 7.0 (*) 0.0 - 2.0 mmol/L   Patient temperature 99.2 F     Collection site ARTERIAL LINE     Drawn by Operator     Sample type ARTERIAL    GLUCOSE, CAPILLARY     Status: Abnormal   Collection Time    04/27/13 11:29 AM      Result Value Ref Range   Glucose-Capillary 119 (*) 70 - 99 mg/dL  GLUCOSE, CAPILLARY      Status: Abnormal   Collection Time    04/27/13  3:16 PM      Result Value Ref Range   Glucose-Capillary 144 (*) 70 - 99 mg/dL  GLUCOSE, CAPILLARY     Status: Abnormal   Collection Time    04/28/13 12:02 AM      Result Value Ref Range   Glucose-Capillary 135 (*) 70 - 99 mg/dL   Comment 1 Notify RN    GLUCOSE, CAPILLARY     Status: Abnormal   Collection Time    04/28/13  3:58 AM      Result Value Ref Range   Glucose-Capillary 140 (*) 70 - 99 mg/dL   Comment 1 Notify RN    CBC     Status: Abnormal   Collection Time    04/28/13  6:05 AM      Result Value Ref Range   WBC 19.5 (*) 4.0 - 10.5 K/uL   RBC 2.94 (*) 4.22 - 5.81 MIL/uL   Hemoglobin 9.1 (*) 13.0 - 17.0 g/dL   HCT 26.8 (*) 39.0 - 52.0 %   MCV 91.2  78.0 -  100.0 fL   MCH 31.0  26.0 - 34.0 pg   MCHC 34.0  30.0 - 36.0 g/dL   RDW 16.4 (*) 11.5 - 15.5 %   Platelets 73 (*) 150 - 400 K/uL   Comment: CONSISTENT WITH PREVIOUS RESULT  BASIC METABOLIC PANEL     Status: Abnormal   Collection Time    04/28/13  6:05 AM      Result Value Ref Range   Sodium 148 (*) 137 - 147 mEq/L   Potassium 4.0  3.7 - 5.3 mEq/L   Chloride 117 (*) 96 - 112 mEq/L   CO2 23  19 - 32 mEq/L   Glucose, Bld 135 (*) 70 - 99 mg/dL   BUN 24 (*) 6 - 23 mg/dL   Creatinine, Ser 0.82  0.50 - 1.35 mg/dL   Calcium 7.8 (*) 8.4 - 10.5 mg/dL   GFR calc non Af Amer >90  >90 mL/min   GFR calc Af Amer >90  >90 mL/min   Comment: (NOTE)     The eGFR has been calculated using the CKD EPI equation.     This calculation has not been validated in all clinical situations.     eGFR's persistently <90 mL/min signify possible Chronic Kidney     Disease.  GLUCOSE, CAPILLARY     Status: Abnormal   Collection Time    04/28/13 11:15 AM      Result Value Ref Range   Glucose-Capillary 128 (*) 70 - 99 mg/dL  GLUCOSE, CAPILLARY     Status: Abnormal   Collection Time    04/28/13  3:15 PM      Result Value Ref Range   Glucose-Capillary 123 (*) 70 - 99 mg/dL    Imaging /  Studies: No results found.  Medications / Allergies: per chart  Antibiotics: Anti-infectives   Start     Dose/Rate Route Frequency Ordered Stop   04/26/13 0600  [MAR Hold]  cefOXitin (MEFOXIN) 2 g in dextrose 5 % 50 mL IVPB     (On MAR Hold since 04/26/13 0512)   2 g 100 mL/hr over 30 Minutes Intravenous On call to O.R. 04/26/13 0425 04/26/13 0533       Assessment/Plan: Hx bipolar disorder Hemorrhagic shock COPD/ARF ABL anemia  UGI bleeding Exploratory laparotomy, oversew of gastric AVM, repair of gastrotomy (Dr. Hulen Skains 04/26/13) POD#3 Continue strict NPO today UGI tomorrow to rule out a leak Mobilize, IS IV hydration Pain control Staples will need to be removed in 5-7 days  Erby Pian, [facility] Surgery Pager [phone] Office [phone]  04/29/2013 9:24 AM

## 2013-04-29 NOTE — Progress Notes (Signed)
eLink Physician-Brief Progress Note Patient Name: Tyrone [name] DOB: 1955/08/27 MRN: [id number]  Date of Service  04/29/2013   HPI/Events of Note   Agitated delirium.  eICU Interventions   Will order prn IV haldol.  Will order restraints.     Intervention Category Major Interventions: Change in mental status - evaluation and management  [name] 04/29/2013, 4:20 AM

## 2013-04-29 NOTE — Progress Notes (Signed)
Report given to the receiving rn on 2w. Pt Transferred to 2w26. Receiving at bedside. Sitter with pt.

## 2013-04-29 NOTE — Progress Notes (Signed)
Labs ok abd benign  UGI in AM to make sure no leak  Mary Sella. Andrey Campanile, MD, FACS General, Bariatric, & Minimally Invasive Surgery [facility], [place]

## 2013-04-29 NOTE — Progress Notes (Signed)
NUTRITION FOLLOW-UP  DOCUMENTATION CODES Per approved criteria  -Not Applicable   INTERVENTION: Diet advancement per team's discretion. RD to continue to follow nutrition care plan and need for further interventions.  NUTRITION DIAGNOSIS: Inadequate oral intake related to inability to eat as evidenced by NPO status. Ongoing.  Goal: Diet advancement as tolerated. Patient to meet >/=90% of estimated nutrition needs  Monitor:  PO diet advancement, lab trends, weight trends  ASSESSMENT: Patient transferred from [facility] on 2/12 for UGIB , transient hypotension. He was seen 6 days prior at [facility]. for similar issue but signed out AMA. He was admitted to [facility] service, placed on PPI drip, Octreotide Drip and GI called. Per pt he had: EGD was told (reliability?) for gastric avm? Later in the afternoon on 2/12 he became more anxious and agitated. He left AMA at 1821. He returned within an hour to the ER having passed large volume dk bloody stool and vomiting large volume of blood. PCCM re-admitted. 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  Extubated 2/14.  Low magnesium on 2/14 - repleted with magnesium sulfate. Currently potassium, magnesium and phosphorus WNL. Pt threatening to leave AMA again after extubation, psych consulted on 2/14 - plan for IVC papers and librium protocol.  Remains NPO since extubation, per surgery. Plan for UGI tomorrow to r/o leak.  Height: Ht Readings from Last 1 Encounters:  04/26/13 5' 10.08" (1.78 m)    Weight: Wt Readings from Last 1 Encounters:  04/27/13 171 lb 1.2 oz (77.6 kg)  Admit wt 149 lb; +9 liters since admission  BMI:  Body mass index is 24.49 kg/(m^2). Normal weight  Estimated Nutritional Needs: Kcal: 1700 - 1900 Protein: 100-115 grams Fluid: >1.7 L  Skin: closed incision-abdomen  Diet Order: NPO  EDUCATION NEEDS: -Education not appropriate at this time   Intake/Output Summary (Last 24  hours) at 04/29/13 1109 Last data filed at 04/29/13 0958  Gross per 24 hour  Intake   1900 ml  Output   2850 ml  Net   -950 ml    Last BM: 2/14  Labs:   Recent Labs Lab 04/25/13 1040  04/26/13 0535  04/26/13 1933 04/27/13 0325 04/28/13 0605  NA 140  < > 135*  < > 147 147 148*  K 5.0  < > 4.1  < > 4.2 4.2 4.0  CL 108  < > 101  < > 119* 119* 117*  CO2 22  --  25  < > 19 20 23   BUN 38*  < > 7  < > 36* 30* 24*  CREATININE 0.69  < > 0.88  < > 1.12 0.96 0.82  CALCIUM 8.5  --  8.3*  < > 6.7* 6.9* 7.8*  MG 1.9  --  1.5  --   --  1.6  --   PHOS 2.4  --  2.9  --   --  2.7  --   GLUCOSE 109*  < > 140*  < > 123* 145* 135*  < > = values in this interval not displayed.  CBG (last 3)   Recent Labs  04/28/13 0358 04/28/13 1115 04/28/13 1515  GLUCAP 140* 128* 123*    Scheduled Meds: . antiseptic oral rinse  15 mL Mouth Rinse QID  . chlorhexidine  15 mL Mouth/Throat BID  . LORazepam  1 mg Intravenous Q4H  . nicotine  21 mg Transdermal Q24H  . OLANZapine zydis  5 mg Oral QHS  .  pantoprazole (PROTONIX) IV  40 mg Intravenous Q12H  . pneumococcal 23 valent vaccine  0.5 mL Intramuscular Tomorrow-1000  . thiamine  100 mg Intravenous Daily  . thiamine  100 mg Intravenous Daily    Continuous Infusions: . dextrose 100 mL/hr at 04/28/13 0910    Jarold MottoSamantha Alin Chavira MS, RD, LDN Inpatient Registered Dietitian Pager: [phone] After-hours pager: [phone]

## 2013-04-29 NOTE — Progress Notes (Signed)
Pt cleaned up after period of incontinents. Attempt to provide mouth care and pt became agitated and repeatedly refused. Will continue to monitor pt.

## 2013-04-29 NOTE — Clinical Social Work Note (Addendum)
3:24pm - Psych CSW left message for Morrie Sheldon, pt daughter, to collect collateral information per psychiatry.  CSW awaiting a return call.  3:23pm- As per chart Eloisa Northern, PA-C 04/27/13) pt is under Involuntary Commitment.  Please contact CSW for information or to rescind.    Vickii Penna, LCSWA [phone]  Clinical Social Work

## 2013-04-29 NOTE — Progress Notes (Signed)
eLink Physician-Brief Progress Note Patient Name: Tyrone [name] DOB: 1955/05/22 MRN: [id number]  Date of Service  04/29/2013   HPI/Events of Note   Pt has armband stating "haldol".  No mention in medical record of allergy to haldol otherwise.  Bedside RN questioned pt about previous reactions to haldol >> pt reports it makes him feel confused.   eICU Interventions   Unlikely to have allergy to haldol.  Will continue with plan for prn IV haldol.    Intervention Category Minor Interventions: Communication with other healthcare providers and/or family  Shirley Bolle 04/29/2013, 5:10 AM

## 2013-04-30 ENCOUNTER — Inpatient Hospital Stay (HOSPITAL_COMMUNITY): Payer: Medicare Other

## 2013-04-30 ENCOUNTER — Encounter (HOSPITAL_COMMUNITY): Payer: Self-pay | Admitting: General Surgery

## 2013-04-30 LAB — CBC
HEMATOCRIT: 25.2 % — AB (ref 39.0–52.0)
Hemoglobin: 8.7 g/dL — ABNORMAL LOW (ref 13.0–17.0)
MCH: 31.5 pg (ref 26.0–34.0)
MCHC: 34.5 g/dL (ref 30.0–36.0)
MCV: 91.3 fL (ref 78.0–100.0)
Platelets: 155 10*3/uL (ref 150–400)
RBC: 2.76 MIL/uL — AB (ref 4.22–5.81)
RDW: 16.1 % — ABNORMAL HIGH (ref 11.5–15.5)
WBC: 17.3 10*3/uL — AB (ref 4.0–10.5)

## 2013-04-30 LAB — BASIC METABOLIC PANEL
BUN: 5 mg/dL — AB (ref 6–23)
CO2: 27 meq/L (ref 19–32)
CREATININE: 0.79 mg/dL (ref 0.50–1.35)
Calcium: 8 mg/dL — ABNORMAL LOW (ref 8.4–10.5)
Chloride: 108 mEq/L (ref 96–112)
GFR calc Af Amer: >90 mL/min (ref >90)
GFR calc non Af Amer: >90 mL/min (ref >90)
Glucose, Bld: 121 mg/dL — ABNORMAL HIGH (ref 70–99)
Potassium: 3 mEq/L — ABNORMAL LOW (ref 3.7–5.3)
Sodium: 145 mEq/L (ref 137–147)

## 2013-04-30 LAB — URINALYSIS, ROUTINE W REFLEX MICROSCOPIC
Bilirubin Urine: NEGATIVE
Glucose, UA: NEGATIVE mg/dL
HGB URINE DIPSTICK: NEGATIVE
Ketones, ur: NEGATIVE mg/dL
Leukocytes, UA: NEGATIVE
Nitrite: NEGATIVE
PROTEIN: NEGATIVE mg/dL
Specific Gravity, Urine: 1.009 (ref 1.005–1.030)
Urobilinogen, UA: 0.2 mg/dL (ref 0.0–1.0)
pH: 7.5 (ref 5.0–8.0)

## 2013-04-30 LAB — HEMOGLOBIN AND HEMATOCRIT, BLOOD
HCT: 26.4 % — ABNORMAL LOW (ref 39.0–52.0)
HEMOGLOBIN: 9.1 g/dL — AB (ref 13.0–17.0)

## 2013-04-30 MED ORDER — FUROSEMIDE 10 MG/ML IJ SOLN
20.0000 mg | Freq: Once | INTRAMUSCULAR | Status: AC
  Administered 2013-04-30: 20 mg via INTRAVENOUS
  Filled 2013-04-30: qty 2

## 2013-04-30 MED ORDER — POTASSIUM CHLORIDE CRYS ER 20 MEQ PO TBCR
40.0000 meq | EXTENDED_RELEASE_TABLET | Freq: Two times a day (BID) | ORAL | Status: AC
  Administered 2013-04-30 (×2): 40 meq via ORAL
  Filled 2013-04-30 (×2): qty 2

## 2013-04-30 MED ORDER — DEXTROSE 5 % IV SOLN
INTRAVENOUS | Status: AC
  Administered 2013-04-30: 1000 mL via INTRAVENOUS

## 2013-04-30 MED ORDER — HYDROCODONE-ACETAMINOPHEN 5-325 MG PO TABS
1.0000 | ORAL_TABLET | Freq: Four times a day (QID) | ORAL | Status: DC | PRN
Start: 2013-04-30 — End: 2013-05-02
  Administered 2013-04-30 – 2013-05-02 (×6): 1 via ORAL
  Filled 2013-04-30 (×6): qty 1

## 2013-04-30 MED ORDER — IOHEXOL 300 MG/ML  SOLN
150.0000 mL | Freq: Once | INTRAMUSCULAR | Status: AC | PRN
  Administered 2013-04-30: 150 mL via ORAL

## 2013-04-30 MED ORDER — METOPROLOL TARTRATE 50 MG PO TABS
50.0000 mg | ORAL_TABLET | Freq: Two times a day (BID) | ORAL | Status: DC
  Administered 2013-04-30 – 2013-05-02 (×5): 50 mg via ORAL
  Filled 2013-04-30 (×7): qty 1

## 2013-04-30 NOTE — Progress Notes (Signed)
Patient continues to have loose bloody stools. Reported to Dia Sitter, NP with [name] of patient status. H&H was ordered STAT. Patient c/o severe pain in abdomin requiring 1mg . Dilaudid q 3hr and 325mg  Norco q 6hr. PRN. Patient states that, "MD informed of discharge 05/01/13". Will continue to monitor.

## 2013-04-30 NOTE — Progress Notes (Signed)
Episode of emesis yesterday. Had UGI this am - no leak. But pt and nurse report pt just old blood dark blood per rectum  Alert, nad, Soft, nt, nd, incision c/d/i  Hopefully just passing old clot.  hgb essentially stable Cont bid protonix Clears as tolerated Repeat cbc in am  Tyrone [name]. Andrey Campanile, MD, FACS General, Bariatric, & Minimally Invasive Surgery [facility], [place]

## 2013-04-30 NOTE — Progress Notes (Signed)
Progress Note  Tyrone [name] HLK:[id number] DOB: 1955-10-16 DOA: 04/25/2013 PCP: No primary provider on file.   Admit HPI / Brief Narrative:   58 yo M transferred from [facility] for UGIB w/ transient hypotension. He was seen 6 days prior in [facility] for similar issue but signed out AMA. He presented to [facility] with chief complaint of right calf pain and had negative LEDS and was being discharged when he vomited blood and passed out. [place] [facility] [place] [facility] were on diversion therefore he was transferred to [facility].  He was placed on PPI drip, Octreotide Drip and GI was consulted. He has known non-compliance, TBI, and bipolar disorder.      SIGNIFICANT EVENTS / STUDIES:   2/12 Signed out AMA, re-admitted an hour later  2/12 Transfused 4 units of PRBC  2/13 Endoscopy >>> active bleeding at greater curvature  2/13 OR >>> Exploratory lap, oversew of gastric AV malformation, repair of gastrotomy OETT 2/13 >>> 2/14  NGT 2/13 >>> 2/14  RIJ TLC 2/13 >>> 2/14  Foley 2/13 >>> 2/15  R rad a-line ??? >>> 2/14 Upper GI series on 04/30/2013 showing no bowel leak    HPI/Subjective:  Pt bed, no headache, no chest pain, mild postop epigastric abdominal discomfort, no nausea vomiting or focal weakness.     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 Hemorrhagic shock has resolved her transfusions and s/p surgical repair - ongoing care per Gen Surg Surgery following, and an unremarkable upper GI series with no bowel leak, has been started on clear liquids today. We will monitor closely.    Acute blood loss anemia Due to GIB - Hgb stable - follow      Nonspecific leukocytosis  The due to stress from primary problem in exploratory lap which was required, however will check a septic workup, we'll check a chest x-ray, UA, afebrile blood cultures.     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 Consulted case Estate agent for eventual placement      Code Status: FULL Family Communication: no family present at time of exam Disposition Plan: Consulted case Production designer, theatre/television/film and Child psychotherapist for eventual placement currently under IVC    Consultants: Gen Surgery  PCCM >> [facility] GI Psychiatry    Antibiotics: None   DVT prophylaxis: SCDs  Objective: Blood pressure 162/87, pulse 104, temperature 98.8 F (37.1 C), temperature source Oral, resp. rate 20, height 5' 10.08" (1.78 m), weight 77.6 kg (171 lb 1.2 oz), SpO2 94.00%.  Intake/Output Summary (Last 24 hours) at 04/30/13 1021 Last data filed at 04/30/13 7342  Gross per 24 hour  Intake 1126.25 ml  Output   4850 ml  Net -3723.75 ml   Exam: General: No acute respiratory distress at rest in bed  Lungs: Clear to auscultation bilaterally without wheezes or crackles Cardiovascular: Regular rate and rhythm without murmur gallop or rub  Abdomen: Nondistended, soft, midline staples w/o d/c or erythema, no rebound,  no ascites Extremities: No significant cyanosis, clubbing, or edema bilateral lower extremities  Data Reviewed: Basic Metabolic Panel:  Recent Labs Lab 04/25/13 1040  04/26/13 0535  04/26/13 0755 04/26/13 1933 04/27/13 0325 04/28/13 0605 04/30/13 0413  NA 140  < > 135*  < > 142 147 147 148* 145  K 5.0  < > 4.1  < > 6.1* 4.2 4.2 4.0 3.0*  CL 108  < > 101  --  115* 119* 119* 117* 108  CO2 22  --  25  --  19 19 20 23 27   GLUCOSE 109*  < > 140*  --  221* 123* 145* 135* 121*  BUN 38*  < > 7  --  38*  36* 30* 24* 5*  CREATININE 0.69  < > 0.88  --  1.02 1.12 0.96 0.82 0.79  CALCIUM 8.5  --  8.3*  --  6.6* 6.7* 6.9* 7.8* 8.0*  MG 1.9  --  1.5  --   --   --  1.6  --   --   PHOS 2.4  --  2.9  --   --   --  2.7  --   --   < > = values in this interval not displayed.  Liver Function Tests:  Recent Labs Lab 04/25/13 1040  AST 27  ALT 21  ALKPHOS 54  BILITOT 0.4  PROT 5.1*  ALBUMIN 2.6*   CBC:  Recent Labs Lab 04/26/13 0618 04/26/13 0755 04/26/13 1933 04/27/13 0325 04/28/13 0605 04/30/13 0413  WBC  --  11.2* 21.7* 21.3* 19.5* 17.3*  NEUTROABS  --  6.9  --   --   --   --   HGB 8.2* 11.8* 9.6* 9.3* 9.1* 8.7*  HCT 24.0* 33.2* 26.9* 26.5* 26.8* 25.2*  MCV  --  86.7 86.2 87.5 91.2 91.3  PLT  --  22* 62* 61* 73* 155   CBG:  Recent Labs Lab 04/27/13 1516 04/28/13 0002 04/28/13 0358 04/28/13 1115 04/28/13 1515  GLUCAP 144* 135* 140* 128* 123*    Recent Results (from the past 240 hour(s))  MRSA PCR SCREENING     Status: None   Collection Time    04/25/13  8:40 AM      Result Value Ref Range Status   MRSA by PCR NEGATIVE  NEGATIVE Final   Comment:            The GeneXpert MRSA Assay (FDA     approved for NASAL specimens     only), is one component of a     comprehensive MRSA colonization     surveillance program. It is not     intended to diagnose MRSA     infection nor to guide or     monitor treatment for     MRSA infections.  MRSA PCR SCREENING     Status: None   Collection Time    04/25/13 11:35 PM      Result Value Ref Range Status   MRSA by PCR NEGATIVE  NEGATIVE Final   Comment:            The GeneXpert MRSA Assay (FDA     approved for NASAL specimens     only), is one component of a     comprehensive MRSA colonization     surveillance program. It is not     intended to diagnose MRSA     infection nor to guide or     monitor treatment for  MRSA infections.     Studies:  Recent x-ray studies have been reviewed in detail by the Attending  Physician  Scheduled Meds:  Scheduled Meds: . antiseptic oral rinse  15 mL Mouth Rinse QID  . chlorhexidine  15 mL Mouth/Throat BID  . LORazepam  1 mg Intravenous Q4H  . nicotine  21 mg Transdermal Q24H  . OLANZapine zydis  5 mg Oral QHS  . pantoprazole (PROTONIX) IV  40 mg Intravenous Q12H  . pneumococcal 23 valent vaccine  0.5 mL Intramuscular Tomorrow-1000  . potassium chloride  40 mEq Oral BID  . thiamine  100 mg Intravenous Daily    Time spent on care of this patient: 35 mins   Leroy Sea  [facility] Office  [phone] Pager - Text Page per Loretha Stapler as per below:  On-Call/Text Page:      [email or web address]      password [email or web address]    LOS: 5 days

## 2013-04-30 NOTE — Progress Notes (Signed)
4 Days Post-Op  Subjective: Sitter at bedside.  episode of emesis yesterday, denies n/v.    Objective: Vital signs in last 24 hours: Temp:  [98.8 F (37.1 C)-99.9 F (37.7 C)] 98.8 F (37.1 C) (02/17 0429) Pulse Rate:  [56-104] 104 (02/17 0429) Resp:  [17-20] 20 (02/17 0429) BP: (152-186)/(76-99) 162/87 mmHg (02/17 0429) SpO2:  [93 %-98 %] 94 % (02/17 0429) Last BM Date: 04/27/13  Intake/Output from previous day: 02/16 0701 - 02/17 0700 In: 1426.3 [I.V.:1426.3] Out: 6700 [Urine:6700] Intake/Output this shift:   Physical Exam:  General: Pt awake/alert/oriented x3. Inappropriate.  Abdomen: Soft. Non distended.  Appropriately tender. Midline incision- staples are intact, edges are approximated and without erythema.    Lab Results:   Recent Labs  04/28/13 0605 04/30/13 0413  WBC 19.5* 17.3*  HGB 9.1* 8.7*  HCT 26.8* 25.2*  PLT 73* 155   BMET  Recent Labs  04/28/13 0605 04/30/13 0413  NA 148* 145  K 4.0 3.0*  CL 117* 108  CO2 23 27  GLUCOSE 135* 121*  BUN 24* 5*  CREATININE 0.82 0.79  CALCIUM 7.8* 8.0*   PT/INR No results found for this basename: LABPROT, INR,  in the last 72 hours ABG  Recent Labs  04/27/13 1021  PHART 7.303*  HCO3 18.5*    Studies/Results: Dg Ugi W/water Sol Cm  04/30/2013   CLINICAL DATA:  Exploratory laparotomy with oversew of gastric AV malformation and repair of gastrostomy 04/26/2013. Question leak.  EXAM: WATER SOLUBLE UPPER GI SERIES  TECHNIQUE: Single-column upper GI series was performed using water soluble contrast.  CONTRAST:  150 ml Omnipaque 300 per mouth.  COMPARISON:  None.  FLUOROSCOPY TIME:  59 seconds.  FINDINGS: The patient swallowed contrast without difficulty. No laryngeal penetration was observed. The esophageal motility is within normal limits.  The stomach was incompletely distended, but demonstrates normal emptying and no gross filling defects. There is some mucosal thickening. There is no extravasation.   IMPRESSION: No evidence of leak following gastric surgery. The stomach empties satisfactorily.   Electronically Signed   By: Roxy Horseman M.D.   On: 04/30/2013 09:33    Anti-infectives: Anti-infectives   Start     Dose/Rate Route Frequency Ordered Stop   04/26/13 0600  [MAR Hold]  cefOXitin (MEFOXIN) 2 g in dextrose 5 % 50 mL IVPB     (On MAR Hold since 04/26/13 0512)   2 g 100 mL/hr over 30 Minutes Intravenous On call to O.R. 04/26/13 0425 04/26/13 0533      Assessment/Plan: Hx bipolar disorder  Hemorrhagic shock  COPD/ARF  ABL anemia   UGI bleeding  Exploratory laparotomy, oversew of gastric AVM, repair of gastrotomy (Dr. Lindie Spruce 04/26/13)  POD#4  UGI negative for a leak.  Start sips of clears today.  Mobilize, IS  IV hydration  Pain control  Staples will need to be removed in 4-6 days   LOS: 5 days    Mikahla Wisor ANP-BC 04/30/2013 10:06 AM

## 2013-04-30 NOTE — Consult Note (Signed)
Reason for Consult: Bipolar disorder, S/P surgery Referring Physician: Dr. [name] is an 58 y.o. male.  HPI: Patient was seen face-to-face for this psychiatric consultation followup.  patient has been compliant with his medication management and denies current symptoms of depression, anxiety and psychosis. Patient has no reported suicide or homicidal ideations. Patient has no evidence of psychotic symptoms. Patient has been ruminated about his medication Xanax and Lortab. Patient reported he was taken Abilify in the past and is willing to continue this medication Zyprexa at this time. His Zyprexa can be increased to 10 mg if needed for irritability has decided aggressive behaviors. Patient has history of emotional problems years ago but denied current emotional problems. Patient stated that he lives with his sister and five children in [place] for the last 3 weeks. He stated that he was  in [place] and flew into [place] to stay with his sister and children.    Mental Status Examination: Patient appeared as per his stated age, poorly groomed, unshaven beard and fair eye contact. Patient has  less anxious mood and  appropriate affect. He has normal rate, rhythm, and low volume of speech, occasionally difficult to follow because has no dentures in his mouth. His thought process is linear and goal directed. Patient has denied suicidal, homicidal ideations, intentions or plans. Patient has no evidence of auditory or visual hallucinations, delusions, and paranoia. Patient has fair insight judgment and impulse control.  Past Medical History  Diagnosis Date  . Myocardial infarction     2008    Past Surgical History  Procedure Laterality Date  . Splenectomy    . Coronary angioplasty with stent placement    . Esophagogastroduodenoscopy N/A 04/26/2013    Procedure: ESOPHAGOGASTRODUODENOSCOPY (EGD);  Surgeon: Wonda Horner, MD;  Location: [facility] ENDOSCOPY;  Service: Endoscopy;   Laterality: N/A;  . Laparotomy N/A 04/26/2013    Procedure: EXPLORATORY LAPAROTOMY, OVERSEW OF GASTRIC AV MALFORMATION, REPAIR OF GASTROTOMY;  Surgeon: Gwenyth Ober, MD;  Location: [facility];  Service: General;  Laterality: N/A;    History reviewed. No pertinent family history.  Social History:  reports that he has been smoking Cigarettes.  He has a 40 pack-year smoking history. He does not have any smokeless tobacco history on file. He reports that he does not drink alcohol or use illicit drugs.  Allergies:  Allergies  Allergen Reactions  . Nitroglycerin Nausea And Vomiting  . Toradol [Ketorolac Tromethamine] Itching  . Ultram [Tramadol] Itching    Medications: I have reviewed the patient's current medications.  Results for orders placed during the hospital encounter of 04/25/13 (from the past 48 hour(s))  CBC     Status: Abnormal   Collection Time    04/30/13  4:13 AM      Result Value Ref Range   WBC 17.3 (*) 4.0 - 10.5 K/uL   RBC 2.76 (*) 4.22 - 5.81 MIL/uL   Hemoglobin 8.7 (*) 13.0 - 17.0 g/dL   HCT 25.2 (*) 39.0 - 52.0 %   MCV 91.3  78.0 - 100.0 fL   MCH 31.5  26.0 - 34.0 pg   MCHC 34.5  30.0 - 36.0 g/dL   RDW 16.1 (*) 11.5 - 15.5 %   Platelets 155  150 - 400 K/uL   Comment: DELTA CHECK NOTED     REPEATED TO VERIFY  BASIC METABOLIC PANEL     Status: Abnormal   Collection Time    04/30/13  4:13 AM  Result Value Ref Range   Sodium 145  137 - 147 mEq/L   Potassium 3.0 (*) 3.7 - 5.3 mEq/L   Comment: DELTA CHECK NOTED   Chloride 108  96 - 112 mEq/L   Comment: DELTA CHECK NOTED   CO2 27  19 - 32 mEq/L   Glucose, Bld 121 (*) 70 - 99 mg/dL   BUN 5 (*) 6 - 23 mg/dL   Comment: DELTA CHECK NOTED   Creatinine, Ser 0.79  0.50 - 1.35 mg/dL   Calcium 8.0 (*) 8.4 - 10.5 mg/dL   GFR calc non Af Amer >90  >90 mL/min   GFR calc Af Amer >90  >90 mL/min   Comment: (NOTE)     The eGFR has been calculated using the CKD EPI equation.     This calculation has not been validated in  all clinical situations.     eGFR's persistently <90 mL/min signify possible Chronic Kidney     Disease.    Dg Chest Port 1 View  04/30/2013   CLINICAL DATA:  Cough.  EXAM: PORTABLE CHEST - 1 VIEW  COMPARISON:  04/27/2013.  FINDINGS: The cardiac silhouette remains borderline enlarged. Increased prominence of the pulmonary vasculature and interstitial markings. Oral contrast in the stomach and splenic flexure. Unremarkable bones.  IMPRESSION: Interval mild changes of congestive heart failure.   Electronically Signed   By: Enrique Sack M.D.   On: 04/30/2013 11:09   Dg Ugi W/water Sol Cm  04/30/2013   CLINICAL DATA:  Exploratory laparotomy with oversew of gastric AV malformation and repair of gastrostomy 04/26/2013. Question leak.  EXAM: WATER SOLUBLE UPPER GI SERIES  TECHNIQUE: Single-column upper GI series was performed using water soluble contrast.  CONTRAST:  150 ml Omnipaque 300 per mouth.  COMPARISON:  None.  FLUOROSCOPY TIME:  59 seconds.  FINDINGS: The patient swallowed contrast without difficulty. No laryngeal penetration was observed. The esophageal motility is within normal limits.  The stomach was incompletely distended, but demonstrates normal emptying and no gross filling defects. There is some mucosal thickening. There is no extravasation.  IMPRESSION: No evidence of leak following gastric surgery. The stomach empties satisfactorily.   Electronically Signed   By: Camie Patience M.D.   On: 04/30/2013 09:33    Positive for anxiety, sleep disturbance and restlessness Blood pressure 123/73, pulse 92, temperature 98.2 F (36.8 C), temperature source Oral, resp. rate 20, height 5' 10.08" (1.78 m), weight 77.6 kg (171 lb 1.2 oz), SpO2 96.00%.   Assessment/Plan: Bipolar disorder by history Anxiety disorder not otherwise specified  Recommendation: Patient does not meet criteria for acute psychiatric hospitalization as he has no safety concerns Discontinue safety sitter and rescind IVC   Continue Zyprexa Zydis 5 mg PO Qhs / agitation  Patient may be discharged to community care/outpatient psychiatric care in [place], [place] when he was medically stable  Appreciate psych consult and will sign off at this time May call [phone] if needed further assistance  Kinta Martis,JANARDHAHA R. 04/30/2013, 3:24 PM

## 2013-04-30 NOTE — Consult Note (Signed)
Agree with plan 

## 2013-04-30 NOTE — Clinical Social Work Psych Assess (Signed)
Clinical Social Work Department CLINICAL SOCIAL WORK PSYCHIATRY SERVICE LINE ASSESSMENT 04/30/2013  Patient:  Tyrone [name]  Account:  [id number]  Admit Date:  04/25/2013  Clinical Social Worker:  Read DriversEGINA Shantai Tiedeman, LCSWA  Date/Time:  04/30/2013 01:01 PM Referred by:  Physician  Date referred:  04/30/2013 Reason for Referral  Crisis Intervention  Behavioral Health Issues   Presenting Symptoms/Problems (In the person's/family's own words):   Psych was consulted for pt having to be Involuntarily Committed (IVC'd) and wanting to leave AMA.  Pt also has bipolar dx.   Abuse/Neglect/Trauma History (check all that apply)  Denies history   Abuse/Neglect/Trauma Comments:   mother denies   Psychiatric History (check all that apply)  Denies history   Psychiatric medications:  LORazepam (ATIVAN) injection 1 mg    OLANZapine zydis (ZYPREXA) disintegrating tablet 5 mg    Recommendation on 04/28/2013: Start Zyprexa Zydis 5 mg PO Qhs / agitation   Current Mental Health Hospitalizations/Previous Mental Health History:   unknown   Current provider:   unknown   Place and Date:   unknown   Current Medications:   Scheduled Meds:      . antiseptic oral rinse  15 mL Mouth Rinse QID  . chlorhexidine  15 mL Mouth/Throat BID  . furosemide  20 mg Intravenous Once  . LORazepam  1 mg Intravenous Q4H  . metoprolol tartrate  50 mg Oral BID  . nicotine  21 mg Transdermal Q24H  . OLANZapine zydis  5 mg Oral QHS  . pantoprazole (PROTONIX) IV  40 mg Intravenous Q12H  . pneumococcal 23 valent vaccine  0.5 mL Intramuscular Tomorrow-1000  . potassium chloride  40 mEq Oral BID  . thiamine  100 mg Intravenous Daily        Continuous Infusions:      . dextrose 1,000 mL (04/30/13 1308)          PRN Meds:.albuterol, haloperidol lactate, HYDROcodone-acetaminophen, HYDROmorphone (DILAUDID) injection, ondansetron (ZOFRAN) IV       Previous Impatient Admission/Date/Reason:   [facility] a week ago where he left AMA.  [facility] again [facility] A[facility].  [facility] for emesis and bloody stools.   Emotional Health / Current Symptoms    Suicide/Self Harm  Suicide attempt in past (date/description)   Suicide attempt in the past:   Mother reports "years ago" though was unsure how exactly how many - pt slit wrist after his wife left him   Other harmful behavior:   non-compliant with medications and medical advice   Psychotic/Dissociative Symptoms  Confusion  Unable to accurately assess   Other Psychotic/Dissociative Symptoms:   Pt is calm and more cooperative this morning per RN staff.    Attention/Behavioral Symptoms  Withdrawn  Impulsive  Restless  Physicial aggression   Other Attention / Behavioral Symptoms:   no other sx reported or noted in the chart    Cognitive Impairment  Poor Judgement  Poor/Impaired Decision-Making   Other Cognitive Impairment:   Pt speech pressured at times and often difficult to understand.    Mood and Adjustment  Aggressive/frustrated  Anxious  Confused  DEPRESSION  Guarded  Unstable/Inconsistent    Stress, Anxiety, Trauma, Any Recent Loss/Stressor  Grief/Loss (recent or history)  Anxiety  Avoidance   Anxiety (frequency):   Pt exhibits moderate anxiety due to being homeless and coping with his medical conditions   Phobia (specify):   none reported  or noted in the chart   Compulsive behavior (specify):   none reported or noted in the chart   Obsessive behavior (specify):   none reported or noted in the chart   Other:   none reported other than those that are listed above   Substance Abuse/Use  History of substance use   SBIRT completed (please refer for detailed history):  N  Self-reported substance use:   unknown   Urinary Drug Screen Completed:  N Alcohol level:   untested    Environmental/Housing/Living Arrangement  HOMELESS   Who is in  the home:   Pt mother reports that pt is homeless.  Pt mother states that "Mental Health" placed him in a home.  Mother is unsure if the "home" is a homeless shelter.  Pt mother states that the "home" will be through the first of March and then the pt would be "released".   Emergency contact:  Mother Tyrone [name]   Teacher, early years/pre Security Income  Medicaid  Private Insurance   Patient's Strengths and Goals (patient's own words):   Pt has dependable income.  Pt has supportive mother and daughter though no one has been at bedside.   Clinical Social Worker's Interpretive Summary:   Psych CSW was consulted for collateral information from family.  Psych CSW contacted pt mother who is having a difficult time dealing with her son's homelessness and SA issues.  Pt mother states that she is "too old" to continue to help her son.  Pt mother states that the pt is homeless and has been living on the streets for "some time".  Pt mother reports that pt does not drink, but does have hx of SA - THC and Xanex.  Pt mother reports that pt has a mental health hx, though was not aware of specific providers, dates or diagnoses.  Pt mother did know that pt was diagosed with Bipolar.    Pt mother states that pt has a daughter, Tyrone [name] who has 5 children.  Pt mother reports that pt and daughter were close.  An attempt was made to contact the daughter, but the number on the facesheet was incorrect.    Pt mother continues to report that pt has been in and out of correctional institution.  Pt mother reports that he was released Feb 2014 and served a sentence for drug charges.    Pt mother states that he [facility] and thought maybe the pt received services at the [place].    Mother reports that pt is "frustrated and anxious" at baseline though mother did not know what frustrated him. Mother also reports pt having extensive heart issues that she and the pt constantly worry  about.    Mother is having a difficult time dealing with her son's mental illness and the choices the son is making regarding his health.  Psych CSW offered emotional support.   Disposition:   Refer to psychiatrist.  MD placed reconsult to request psychiatrist assistance with disposition.    Vickii Penna, LCSWA [phone]  Clinical Social Work

## 2013-05-01 LAB — CBC
HCT: 28.9 % — ABNORMAL LOW (ref 39.0–52.0)
HEMOGLOBIN: 9.7 g/dL — AB (ref 13.0–17.0)
MCH: 31 pg (ref 26.0–34.0)
MCHC: 33.6 g/dL (ref 30.0–36.0)
MCV: 92.3 fL (ref 78.0–100.0)
Platelets: 217 10*3/uL (ref 150–400)
RBC: 3.13 MIL/uL — ABNORMAL LOW (ref 4.22–5.81)
RDW: 16.3 % — ABNORMAL HIGH (ref 11.5–15.5)
WBC: 16.3 10*3/uL — ABNORMAL HIGH (ref 4.0–10.5)

## 2013-05-01 LAB — COMPREHENSIVE METABOLIC PANEL
ALT: 34 U/L (ref 0–53)
AST: 32 U/L (ref 0–37)
Albumin: 2.6 g/dL — ABNORMAL LOW (ref 3.5–5.2)
Alkaline Phosphatase: 68 U/L (ref 39–117)
BUN: 3 mg/dL — AB (ref 6–23)
CO2: 25 mEq/L (ref 19–32)
Calcium: 8.6 mg/dL (ref 8.4–10.5)
Chloride: 102 mEq/L (ref 96–112)
Creatinine, Ser: 0.73 mg/dL (ref 0.50–1.35)
GFR calc non Af Amer: >90 mL/min (ref >90)
GLUCOSE: 129 mg/dL — AB (ref 70–99)
Potassium: 3.2 mEq/L — ABNORMAL LOW (ref 3.7–5.3)
SODIUM: 140 meq/L (ref 137–147)
TOTAL PROTEIN: 5.8 g/dL — AB (ref 6.0–8.3)
Total Bilirubin: 0.8 mg/dL (ref 0.3–1.2)

## 2013-05-01 LAB — URINE CULTURE: Colony Count: 5000

## 2013-05-01 MED ORDER — VITAMIN B-1 100 MG PO TABS
100.0000 mg | ORAL_TABLET | Freq: Every day | ORAL | Status: DC
  Administered 2013-05-02: 100 mg via ORAL
  Filled 2013-05-01: qty 1

## 2013-05-01 NOTE — Clinical Social Work Psych Note (Signed)
Pt will remain Involuntarily Committed and unable to leave AMA due to the pt being at risk for being a danger to himself and no safe dc plan in place.  Pt has hx of leaving AMA multiple times and at this time medical staff feel it is unsafe and pt will remain under IVC.  Please contact Psych CSW with questions regarding IVC and rescind-ing.  Vickii Penna, LCSWA [phone]  Clinical Social Work

## 2013-05-01 NOTE — Progress Notes (Signed)
Pt became very agitated and insisted on leaving to smoke a cigarette.  PRN Haldol administered.  Security to room to talk to patient.  Pt encouraged to lay back in bed.  Pt finally laid in bed and stated he would stay until morning.  Sitter at bedside.  Will continue to monitor closely.  Call bell within reach.

## 2013-05-01 NOTE — Progress Notes (Signed)
[facility] PROGRESS NOTE  Nettie ElmJohn Beverlin ZOX:[id number] DOB: 1955/05/26 DOA: 04/25/2013 PCP: No primary provider on file.  58 yo M [facility] for UGIB w/ transient hypotension. He was seen [facility] days prior in [facility] for similar issue but signed out AMA. He [facility] with chief complaint of right calf pain and had negative LEDS and was being discharged when he vomited blood and passed out. [facility] therefore [facility]. He was placed on PPI drip, Octreotide Drip and GI was consulted. He has known non-compliance, TBI, and bipolar disorder.    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  Hemorrhagic shock has resolved her transfusions and s/p surgical repair - ongoing care per Gen Surg  Surgery following, and an unremarkable upper GI series with no bowel leak, has been started on clear liquids today. Patient is homeless and does not have a safe discharge planning although he is requesting to leave. We'll continue to monitor in house until safe discharge plan can be made.  Acute blood loss anemia  Due to GIB - Hgb stable - follow   Nonspecific leukocytosis  The due to stress from primary problem in exploratory lap which was required, however will check a septic workup, we'll check a chest x-ray, UA, afebrile blood cultures.   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  Consulted case Estate agentmanager and social worker for placement  Code Status: FULL  Family  Communication: no family present at time of exam  Disposition Plan: Consulted case Production designer, theatre/television/filmmanager and Child psychotherapistsocial worker for eventual placement currently under IVC    Consultants:  Psychiatry  General surgery  Gastroenterology  SIGNIFICANT EVENTS / STUDIES:  2/12 Signed out AMA, re-admitted an hour later  2/12 Transfused 4 units of PRBC  2/13 Endoscopy >>> active bleeding at greater curvature  2/13 OR >>> Exploratory lap, oversew of gastric AV malformation, repair of gastrotomy  OETT 2/13 >>> 2/14  NGT 2/13 >>> 2/14  RIJ TLC 2/13 >>> 2/14  Foley 2/13 >>> 2/15  R rad a-line ??? >>> 2/14  Upper GI series on 04/30/2013 showing no bowel leak  Antibiotics:  None  HPI/Subjective: The patient mentions that he needs to go home so that he can pay a bill so he will not get kicked out of his house. He reports he needs to see his wife. Per my discussion with nursing and case manager patient is divorced. Currently he does not have a safe plans for discharge and next of kin has not been able to be contacted despite attempts from our team.  Objective: Filed Vitals:   05/01/13 0900  BP: 165/109  Pulse: 89  Temp:   Resp:     Intake/Output Summary (Last 24 hours) at 05/01/13 1735 Last data filed at 05/01/13 0750  Gross per 24 hour  Intake    360 ml  Output   1250 ml  Net   -890 ml   [employer] Weights  04/26/13 0120 04/26/13 0130 04/27/13 0321  Weight: 68 kg (149 lb 14.6 oz) 69.3 kg (152 lb 12.5 oz) 77.6 kg (171 lb 1.2 oz)    Exam:   General:  Pt in NAD, Alert and awake  Cardiovascular: RRR, no MRG  Respiratory: CTA BL, no wheezes  Abdomen: ND, + Bowel sounds  Musculoskeletal:  No cyanosis or clubbing  Data Reviewed: Basic Metabolic Panel:  Recent Labs Lab 04/25/13 1040  04/26/13 0535  04/26/13 1933 04/27/13 0325 04/28/13 0605 04/30/13 0413 05/01/13 0422  NA 140  < > 135*  < > 147 147 148* 145 140  K 5.0  < > 4.1  < > 4.2 4.2 4.0 3.0* 3.2*  CL 108  < > 101  < > 119* 119*  117* 108 102  CO2 22  --  25  < > 19 20 23 27 25   GLUCOSE 109*  < > 140*  < > 123* 145* 135* 121* 129*  BUN 38*  < > 7  < > 36* 30* 24* 5* 3*  CREATININE 0.69  < > 0.88  < > 1.12 0.96 0.82 0.79 0.73  CALCIUM 8.5  --  8.3*  < > 6.7* 6.9* 7.8* 8.0* 8.6  MG 1.9  --  1.5  --   --  1.6  --   --   --   PHOS 2.4  --  2.9  --   --  2.7  --   --   --   < > = values in this interval not displayed. Liver Function Tests:  Recent Labs Lab 04/25/13 1040 05/01/13 0422  AST 27 32  ALT 21 34  ALKPHOS 54 68  BILITOT 0.4 0.8  PROT 5.1* 5.8*  ALBUMIN 2.6* 2.6*   No results found for this basename: LIPASE, AMYLASE,  in the last 168 hours No results found for this basename: AMMONIA,  in the last 168 hours CBC:  Recent Labs Lab 04/26/13 0618 04/26/13 0755 04/26/13 1933 04/27/13 0325 04/28/13 0605 04/30/13 0413 04/30/13 1545 05/01/13 0422  WBC  --  11.2* 21.7* 21.3* 19.5* 17.3*  --  16.3*  NEUTROABS  --  6.9  --   --   --   --   --   --   HGB 8.2* 11.8* 9.6* 9.3* 9.1* 8.7* 9.1* 9.7*  HCT 24.0* 33.2* 26.9* 26.5* 26.8* 25.2* 26.4* 28.9*  MCV  --  86.7 86.2 87.5 91.2 91.3  --  92.3  PLT  --  22* 62* 61* 73* 155  --  217   Cardiac Enzymes:  Recent Labs Lab 04/25/13 1040  TROPONINI <0.30   BNP (last 3 results) No results found for this basename: PROBNP,  in the last 8760 hours CBG:  Recent Labs Lab 04/27/13 1516 04/28/13 0002 04/28/13 0358 04/28/13 1115 04/28/13 1515  GLUCAP 144* 135* 140* 128* 123*    Recent Results (from the past 240 hour(s))  MRSA PCR SCREENING     Status: None   Collection Time    04/25/13  8:40 AM      Result Value Ref Range Status   MRSA by PCR NEGATIVE  NEGATIVE Final   Comment:            The GeneXpert MRSA Assay (FDA     approved for NASAL specimens     only), is one component of a     comprehensive MRSA colonization     surveillance program. It is not     intended  to diagnose MRSA     infection nor to guide or     monitor treatment for      MRSA infections.  MRSA PCR SCREENING     Status: None   Collection Time    04/25/13 11:35 PM      Result Value Ref Range Status   MRSA by PCR NEGATIVE  NEGATIVE Final   Comment:            The GeneXpert MRSA Assay (FDA     approved for NASAL specimens     only), is one component of a     comprehensive MRSA colonization     surveillance program. It is not     intended to diagnose MRSA     infection nor to guide or     monitor treatment for     MRSA infections.     Studies: Dg Chest Port 1 View  04/30/2013   CLINICAL DATA:  Cough.  EXAM: PORTABLE CHEST - 1 VIEW  COMPARISON:  04/27/2013.  FINDINGS: The cardiac silhouette remains borderline enlarged. Increased prominence of the pulmonary vasculature and interstitial markings. Oral contrast in the stomach and splenic flexure. Unremarkable bones.  IMPRESSION: Interval mild changes of congestive heart failure.   Electronically Signed   By: Gordan Payment M.D.   On: 04/30/2013 11:09   Dg Ugi W/water Sol Cm  04/30/2013   CLINICAL DATA:  Exploratory laparotomy with oversew of gastric AV malformation and repair of gastrostomy 04/26/2013. Question leak.  EXAM: WATER SOLUBLE UPPER GI SERIES  TECHNIQUE: Single-column upper GI series was performed using water soluble contrast.  CONTRAST:  150 ml Omnipaque 300 per mouth.  COMPARISON:  None.  FLUOROSCOPY TIME:  59 seconds.  FINDINGS: The patient swallowed contrast without difficulty. No laryngeal penetration was observed. The esophageal motility is within normal limits.  The stomach was incompletely distended, but demonstrates normal emptying and no gross filling defects. There is some mucosal thickening. There is no extravasation.  IMPRESSION: No evidence of leak following gastric surgery. The stomach empties satisfactorily.   Electronically Signed   By: Roxy Horseman M.D.   On: 04/30/2013 09:33    Scheduled Meds: . antiseptic oral rinse  15 mL Mouth Rinse QID  . chlorhexidine  15 mL Mouth/Throat BID  .  LORazepam  1 mg Intravenous Q4H  . metoprolol tartrate  50 mg Oral BID  . nicotine  21 mg Transdermal Q24H  . OLANZapine zydis  5 mg Oral QHS  . pantoprazole (PROTONIX) IV  40 mg Intravenous Q12H  . pneumococcal 23 valent vaccine  0.5 mL Intramuscular Tomorrow-1000  . [START ON 05/02/2013] thiamine  100 mg Oral Daily   Continuous Infusions:   Principal Problem:   GIB (gastrointestinal bleeding) Active Problems:   COPD exacerbation   Acute respiratory failure   Acute blood loss anemia   Hemorrhagic shock    Time spent: > 35 minutes    Penny Pia  [facility] Pager [phone] If 7PM-7AM, please contact night-coverage at [email or web address], password [facility] 05/01/2013, 5:35 PM  LOS: 6 days

## 2013-05-01 NOTE — Progress Notes (Signed)
5 Days Post-Op  Subjective: Denies n/v. Sore. Eating clears now  Objective: Vital signs in last 24 hours: Temp:  [98.2 F (36.8 C)-99.4 F (37.4 C)] 98.4 F (36.9 C) (02/18 0429) Pulse Rate:  [85-92] 90 (02/18 0523) Resp:  [18-20] 18 (02/17 2016) BP: (123-158)/(73-91) 132/76 mmHg (02/18 0523) SpO2:  [96 %-100 %] 100 % (02/18 0429) Last BM Date: 04/30/13  Intake/Output from previous day: 02/17 0701 - 02/18 0700 In: -  Out: 1250 [Urine:1250] Intake/Output this shift:    Alert, nad Soft, mild distension, incisional soreness. Incision c/d/i  Lab Results:   Recent Labs  04/30/13 0413 04/30/13 1545 05/01/13 0422  WBC 17.3*  --  16.3*  HGB 8.7* 9.1* 9.7*  HCT 25.2* 26.4* 28.9*  PLT 155  --  217   BMET  Recent Labs  04/30/13 0413 05/01/13 0422  NA 145 140  K 3.0* 3.2*  CL 108 102  CO2 27 25  GLUCOSE 121* 129*  BUN 5* 3*  CREATININE 0.79 0.73  CALCIUM 8.0* 8.6   PT/INR No results found for this basename: LABPROT, INR,  in the last 72 hours ABG No results found for this basename: PHART, PCO2, PO2, HCO3,  in the last 72 hours  Studies/Results: Dg Chest Port 1 View  04/30/2013   CLINICAL DATA:  Cough.  EXAM: PORTABLE CHEST - 1 VIEW  COMPARISON:  04/27/2013.  FINDINGS: The cardiac silhouette remains borderline enlarged. Increased prominence of the pulmonary vasculature and interstitial markings. Oral contrast in the stomach and splenic flexure. Unremarkable bones.  IMPRESSION: Interval mild changes of congestive heart failure.   Electronically Signed   By: Gordan Payment M.D.   On: 04/30/2013 11:09   Dg Ugi W/water Sol Cm  04/30/2013   CLINICAL DATA:  Exploratory laparotomy with oversew of gastric AV malformation and repair of gastrostomy 04/26/2013. Question leak.  EXAM: WATER SOLUBLE UPPER GI SERIES  TECHNIQUE: Single-column upper GI series was performed using water soluble contrast.  CONTRAST:  150 ml Omnipaque 300 per mouth.  COMPARISON:  None.  FLUOROSCOPY TIME:   59 seconds.  FINDINGS: The patient swallowed contrast without difficulty. No laryngeal penetration was observed. The esophageal motility is within normal limits.  The stomach was incompletely distended, but demonstrates normal emptying and no gross filling defects. There is some mucosal thickening. There is no extravasation.  IMPRESSION: No evidence of leak following gastric surgery. The stomach empties satisfactorily.   Electronically Signed   By: Roxy Horseman M.D.   On: 04/30/2013 09:33    Anti-infectives: Anti-infectives   Start     Dose/Rate Route Frequency Ordered Stop   04/26/13 0600  [MAR Hold]  cefOXitin (MEFOXIN) 2 g in dextrose 5 % 50 mL IVPB     (On MAR Hold since 04/26/13 0512)   2 g 100 mL/hr over 30 Minutes Intravenous On call to O.R. 04/26/13 0425 04/26/13 0533      Assessment/Plan: s/p Procedure(s): EXPLORATORY LAPAROTOMY, OVERSEW OF GASTRIC AV MALFORMATION, REPAIR OF GASTROTOMY (N/A)  Hypokalemia - defer mgmt to primary team but needs replacement Gastric AVM - oversewn; hgb stable Adv to full liquids  Mary Sella. Andrey Campanile, MD, FACS General, Bariatric, & Minimally Invasive Surgery [facility], [place]   LOS: 6 days    Tyrone [name] 05/01/2013

## 2013-05-01 NOTE — Progress Notes (Signed)
Pt continuously attempting to get out of bed, getting dressed in soiled clothes, and agitated despite giving scheduled Ativan and PRN Haldol.  Sitter at bedside attempting to keep patient in bed.  Pt very unsteady and weak.  Will continue to monitor pt closely.  Call bell within reach.

## 2013-05-01 NOTE — Care Management Note (Signed)
    Page 1 of 2   05/02/2013     4:33:47 PM   CARE MANAGEMENT NOTE 05/02/2013  Patient:  [facility]   Account Number:  [id number]  Date Initiated:  04/26/2013  Documentation initiated by:  [facility]  Subjective/Objective Assessment:   ADmitted with GIB - intubated.     Action/Plan:   Anticipated DC Date:  05/02/2013   Anticipated DC Plan:  HOME/SELF CARE  In-house referral  Clinical Social Worker      DC Planning Services  CM consult      Choice offered to / List presented to:             Status of service:  Completed, signed off Medicare Important Message given?   (If response is "NO", the following Medicare IM given date fields will be blank) Date Medicare IM given:   Date Additional Medicare IM given:    Discharge Disposition:  HOME/SELF CARE  Per UR Regulation:  Reviewed for med. necessity/level of care/duration of stay  If discussed at Long Length of Stay Meetings, dates discussed:   05/02/2013    Comments:  Contact:  Jennings,Ashley Daughter [phone]  05/02/13 [place]ont Skalicky,RN,BSN [phone] ATTEMPTED TO CONT TO REACH FAMILY MEMBERS FOR PT, AS HE IS MEDICALLY STABLE FOR DC HOME.  TELEPHONED MOTHER'S HOUSE, SPOKE WITH HELEN CLARK, MOTHER'S SISTER IN LAW:  SHE STATES MOTHER HAS A RESTRAINING ORDER AGAINST PT CURRENTLY.  MOM IS ELDERLY AND IN POOR HEALTH; SHE CANNOT CARE FOR PT.  PT HAS BEEN SEPARATED FROM HIS WIFE FOR OVER 20 YEARS, AND IS CURRENTLY RESIDING IN [place] IN AN APT.  HIS DAUGHTER ASHLEY HELPS PT AT TIMES, BUT WE HAVE BEEN UNABLE TO REACH HER.  [facility] [place] [place]  MS CLARK STATES SHE WILL ATTEMPT TO CALL PT'S DAUGHTER AND HAVE HER CONTACT THIS CASE MANAGER.  CSW FOLLOWING TO ASSIST WITH DISPOSITION ISSUES.  05/01/13 Doneen Ollinger,RN,BSN [phone] PT REMAINS UNDER MEDICAL IVC, AS MD FEELS HE IS NOT MEDICALLY STABLE FOR DC.  PT STILL WANTS TO LEAVE, STATES HE NEEDS TO PAY HIS RENT "BY 4:00 TODAY, OR HE WILL LOSE HIS HOME".   HE HAS HIS COAT ON, AND HE SEEMS AGITATED. SPOKE WITH CSW FOR PSYCH:  SHE STATES IVC WILL EXPIRE TOMORROW, AT WHICH POINT SHE WILL COMPLETE NEW PAPERWORK TO RENEW, IF IVC STILL NEEDED.   UPDATED ATTENDING MD.   IN THE MEANTIME, PT NEEDS PT/OT CONSULTS.  LEFT MESSAGE FOR PT'S DAUGHTER TO CALL THIS CM BACK TO DISCUSS DISPOSITION. (PHONE # [phone]).   04/29/13- 1130- Donn Pierini RN, BSN [phone] S/p Exploratory laparotomy, oversew of gastric AVM, repair of gastrotomy POD#3---Continue strict NPO today UGI tomorrow to rule out a leak---- continues under IVC per Psych.

## 2013-05-01 NOTE — Progress Notes (Signed)
Unit social worker received consult for placement. Psych Child psychotherapist working with patient at this time. Unit social worker standing by at this time.   Maree Krabbe, MSW, [facility] [phone]

## 2013-05-02 LAB — CBC
HCT: 29.2 % — ABNORMAL LOW (ref 39.0–52.0)
HEMOGLOBIN: 9.7 g/dL — AB (ref 13.0–17.0)
MCH: 30.9 pg (ref 26.0–34.0)
MCHC: 33.2 g/dL (ref 30.0–36.0)
MCV: 93 fL (ref 78.0–100.0)
PLATELETS: 280 10*3/uL (ref 150–400)
RBC: 3.14 MIL/uL — ABNORMAL LOW (ref 4.22–5.81)
RDW: 16.4 % — ABNORMAL HIGH (ref 11.5–15.5)
WBC: 12.5 10*3/uL — ABNORMAL HIGH (ref 4.0–10.5)

## 2013-05-02 MED ORDER — OLANZAPINE 5 MG PO TBDP: 5.0000 mg | ORAL_TABLET | Freq: Every day | ORAL | Status: DC

## 2013-05-02 MED ORDER — HYDROCODONE-ACETAMINOPHEN 5-325 MG PO TABS: 1.0000 | ORAL_TABLET | Freq: Four times a day (QID) | ORAL | Status: DC | PRN

## 2013-05-02 MED ORDER — METOPROLOL TARTRATE 50 MG PO TABS: 50.0000 mg | ORAL_TABLET | Freq: Two times a day (BID) | ORAL | Status: DC

## 2013-05-02 NOTE — Progress Notes (Signed)
Discharged to home with family office visits in place teaching done  Took pt to main into put [facility] [place] [facility] [place]. To [place]

## 2013-05-02 NOTE — Discharge Instructions (Signed)
[facility] [phone]  OPEN ABDOMINAL SURGERY: POST OP INSTRUCTIONS  Always review your discharge instruction sheet given to you by the facility where your surgery was performed.  IF YOU HAVE DISABILITY OR FAMILY LEAVE FORMS, YOU MUST BRING THEM TO THE OFFICE FOR PROCESSING.  PLEASE DO NOT GIVE THEM TO YOUR DOCTOR.  1. A prescription for pain medication may be given to you upon discharge.  Take your pain medication as prescribed, if needed.  If narcotic pain medicine is not needed, then you may take acetaminophen (Tylenol) or ibuprofen (Advil) as needed. 2. Take your usually prescribed medications unless otherwise directed. 3. If you need a refill on your pain medication, please contact your pharmacy. They will contact our office to request authorization.  Prescriptions will not be filled after 5pm or on week-ends. 4. You should follow a light diet the first few days after arrival home, such as soup and crackers, pudding, etc.unless your doctor has advised otherwise. A high-fiber, low fat diet can be resumed as tolerated.   Be sure to include lots of fluids daily. Most patients will experience some swelling and bruising on the chest and neck area.  Ice packs will help.  Swelling and bruising can take several days to resolve 5. Most patients will experience some swelling and bruising in the area of the incision. Ice pack will help. Swelling and bruising can take several days to resolve..  6. It is common to experience some constipation if taking pain medication after surgery.  Increasing fluid intake and taking a stool softener will usually help or prevent this problem from occurring.  A mild laxative (Milk of Magnesia or Miralax) should be taken according to package directions if there are no bowel movements after 48 hours. 7.  You may have steri-strips (small skin tapes) in place directly over the incision.  These strips should be left on the skin for 7-10 days.  If your  surgeon used skin glue on the incision, you may shower in 24 hours.  The glue will flake off over the next 2-3 weeks.  Any sutures or staples will be removed at the office during your follow-up visit. You may find that a light gauze bandage over your incision may keep your staples from being rubbed or pulled. You may shower and replace the bandage daily. 8. ACTIVITIES:  You may resume regular (light) daily activities beginning the next day--such as daily self-care, walking, climbing stairs--gradually increasing activities as tolerated.  You may have sexual intercourse when it is comfortable.  Refrain from any heavy lifting or straining until approved by your doctor. a. You may drive when you no longer are taking prescription pain medication, you can comfortably wear a seatbelt, and you can safely maneuver your car and apply brakes b. Return to Work: ___________________________________ 9. You should see your doctor in the office for a follow-up appointment approximately two weeks after your surgery.  Make sure that you call for this appointment within a day or two after you arrive home to insure a convenient appointment time. OTHER INSTRUCTIONS:  _____________________________________________________________ _____________________________________________________________  WHEN TO CALL YOUR DOCTOR: 1. Fever over 101.0 2. Inability to urinate 3. Nausea and/or vomiting 4. Extreme swelling or bruising 5. Continued bleeding from incision. 6. Increased pain, redness, or drainage from the incision. 7. Difficulty swallowing or breathing 8. Muscle cramping or spasms. 9. Numbness or tingling in hands or feet or around lips.  The clinic staff is available to   answer your questions during regular business hours.  Please don't hesitate to call and ask to speak to one of the nurses if you have concerns.  For further questions, please visit [email or web address]   

## 2013-05-02 NOTE — Progress Notes (Signed)
CSW covering psychiatry consults faxed over new IVC paperwork to Magistrate.   Samuella Bruin, MSW, [facility] Clinical Social Worker [phone]

## 2013-05-02 NOTE — Discharge Summary (Signed)
Physician Discharge Summary  Tyrone [name] ZOX:[id number] DOB: November 12, 1955 DOA: 04/25/2013  PCP: No primary provider on file.  Admit date: 04/25/2013 Discharge date: 05/02/2013  Time spent: > 35 minutes  Recommendations for Outpatient Follow-up:  1. Patient will need to follow up with General surgery on 05/06/13 for staple removal 2. Monitor Hgb levels. 3. Continue to encourage tobacco cessation 4. Monitor Potassium levels.  Discharge Diagnoses:  Principal Problem:   GIB (gastrointestinal bleeding) Active Problems:   COPD exacerbation   Acute respiratory failure   Acute blood loss anemia   Hemorrhagic shock   Discharge Condition: stable, currently patient has apartment which he will be able to transition to (has been confirmed today 05/02/13)  Diet recommendation: soft, bland diet  Filed Weights   04/26/13 0120 04/26/13 0130 04/27/13 0321  Weight: 68 kg (149 lb 14.6 oz) 69.3 kg (152 lb 12.5 oz) 77.6 kg (171 lb 1.2 oz)    History of present illness:  58 year old Caucasian male transferred from [facility] for upper GI bleed, and transient hypotension. Has had multiple visits to the hospital with multiple AMA's.   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  Hemorrhagic shock has resolved. - POD 6 s/p ex lap with gastrotomy and oversew of gastric AVM - General surgery on board who recommended the following: 1. Advance to soft diet  2. Ok for [employer] home from our standpoint once tolerates soft diet. If he is still here tomorrow, due to social issues, it may be beneficial to attempt staple removal at that time, if appropriate.  - Main concern for medical standpoint was disposition. As a we were uncertain whether patient had safe disposition. After multiple phone calls patient has apartment which she will be able to transition to once discharged from the hospital. We'll also have secretary set up followup appointment with Gen.  surgery for staple removal.   Acute blood loss anemia  Due to GIB - Hgb stable - Will recommend continued evaluation as outpatient   Nonspecific leukocytosis  Currently trending down - Urine culture reported no significant growth - Patient has been afebrile  Acute respiratory failure in setting of hemorrhagic shock w/ encephalopathy  resolved   COPD  without exacerbation / well compensated at present   AKI  Most c/w pre-reanl azotemia due to above - renal fxn now normal   Hypernatremia  Resolved with improved oral intake and IV fluids initially.  Bipolar  Per Psych evaluation and recommendations: Patient does not meet criteria for acute psychiatric hospitalization as he has no safety concerns  Discontinue safety sitter and rescind IVC  Continue Zyprexa Zydis 5 mg PO Qhs / agitation  Patient may be discharged to community care/outpatient psychiatric care in [place], [place] when he was medically stable     Delirium  Resolved  Procedures:  As indicated above  Consultations:  General surgery  GI  Discharge Exam: Filed Vitals:   05/02/13 0410  BP: 143/68  Pulse: 79  Temp: 98.1 F (36.7 C)  Resp: 18    General: Pt in nad, alert and awake Cardiovascular: RRR, no MRG Respiratory: CTA BL, no wheezes  Discharge Instructions  Discharge Orders   Future Appointments Provider Department Dept Phone   05/28/2013 10:00 AM Cherylynn Ridges, MD [facility], [place] [phone]   Future Orders Complete By Expires   Call MD for:  difficulty breathing, headache or visual disturbances  As directed    Call MD for:  temperature >100.4  As directed    Diet - low sodium heart healthy  As directed    Increase activity slowly  As directed        Medication List    STOP taking these medications       ALPRAZolam 1 MG tablet  Commonly known as:  XANAX     HYDROcodone-acetaminophen 10-325 MG per tablet  Commonly known as:  NORCO  Replaced by:   HYDROcodone-acetaminophen 5-325 MG per tablet      TAKE these medications       HYDROcodone-acetaminophen 5-325 MG per tablet  Commonly known as:  NORCO/VICODIN  Take 1 tablet by mouth every 6 (six) hours as needed for moderate pain.     metoprolol 50 MG tablet  Commonly known as:  LOPRESSOR  Take 1 tablet (50 mg total) by mouth 2 (two) times daily.     OLANZapine zydis 5 MG disintegrating tablet  Commonly known as:  ZYPREXA  Take 1 tablet (5 mg total) by mouth at bedtime.       Allergies  Allergen Reactions  . Nitroglycerin Nausea And Vomiting  . Toradol [Ketorolac Tromethamine] Itching  . Ultram [Tramadol] Itching       Follow-up Information   Follow up with Cherylynn Ridges, MD On 05/28/2013. (10:00am, arrive by 9:30am for paperwork)    Specialty:  General Surgery   Contact information:   [address], [place] [address]  [facility] [place] [address] [phone]        The results of significant diagnostics from this hospitalization (including imaging, microbiology, ancillary and laboratory) are listed below for reference.    Significant Diagnostic Studies: Dg Chest Port 1 View  04/30/2013   CLINICAL DATA:  Cough.  EXAM: PORTABLE CHEST - 1 VIEW  COMPARISON:  04/27/2013.  FINDINGS: The cardiac silhouette remains borderline enlarged. Increased prominence of the pulmonary vasculature and interstitial markings. Oral contrast in the stomach and splenic flexure. Unremarkable bones.  IMPRESSION: Interval mild changes of congestive heart failure.   Electronically Signed   By: Gordan Payment M.D.   On: 04/30/2013 11:09   Dg Chest Port 1 View  04/27/2013   CLINICAL DATA:  ET tube.  EXAM: PORTABLE CHEST - 1 VIEW  COMPARISON:  Chest radiograph 04/26/2013.  FINDINGS: NG tube courses inferior to the diaphragm. ET tube terminates 5.8 cm superior to the carina. Right IJ central venous catheter tip projects over the superior cavoatrial junction.  Low lung volumes. Stable  cardiac and mediastinal contours. Mild pulmonary vascular redistribution. No definite large consolidative pulmonary opacity. No pleural effusion or pneumothorax.  IMPRESSION: Stable support apparatus.  Mild pulmonary vascular redistribution.   Electronically Signed   By: Annia Belt M.D.   On: 04/27/2013 08:19   Dg Chest Port 1 View  04/26/2013   CLINICAL DATA:  Central venous line placement  EXAM: PORTABLE CHEST - 1 VIEW  COMPARISON:  None.  FINDINGS: The heart size and mediastinal contours are within normal limits. Endotracheal tube is identified distal tip 4.9 cm from carina. A right jugular central venous line is identified distal tip in superior vena cava. A nasogastric tube is identified with distal tip not included on film but is at least in stomach. There is no focal infiltrate, pulmonary edema, or pleural effusion. There is no pneumothorax. The visualized skeletal structures are unremarkable.  IMPRESSION: Right jugular central venous line distal tip in superior vena cava. There is no pneumothorax.   Electronically Signed  By: Sherian Rein M.D.   On: 04/26/2013 01:54   Dg Ugi W/water Sol Cm  04/30/2013   CLINICAL DATA:  Exploratory laparotomy with oversew of gastric AV malformation and repair of gastrostomy 04/26/2013. Question leak.  EXAM: WATER SOLUBLE UPPER GI SERIES  TECHNIQUE: Single-column upper GI series was performed using water soluble contrast.  CONTRAST:  150 ml Omnipaque 300 per mouth.  COMPARISON:  None.  FLUOROSCOPY TIME:  59 seconds.  FINDINGS: The patient swallowed contrast without difficulty. No laryngeal penetration was observed. The esophageal motility is within normal limits.  The stomach was incompletely distended, but demonstrates normal emptying and no gross filling defects. There is some mucosal thickening. There is no extravasation.  IMPRESSION: No evidence of leak following gastric surgery. The stomach empties satisfactorily.   Electronically Signed   By: Roxy Horseman M.D.    On: 04/30/2013 09:33    Microbiology: Recent Results (from the past 240 hour(s))  MRSA PCR SCREENING     Status: None   Collection Time    04/25/13  8:40 AM      Result Value Ref Range Status   MRSA by PCR NEGATIVE  NEGATIVE Final   Comment:            The GeneXpert MRSA Assay (FDA     approved for NASAL specimens     only), is one component of a     comprehensive MRSA colonization     surveillance program. It is not     intended to diagnose MRSA     infection nor to guide or     monitor treatment for     MRSA infections.  MRSA PCR SCREENING     Status: None   Collection Time    04/25/13 11:35 PM      Result Value Ref Range Status   MRSA by PCR NEGATIVE  NEGATIVE Final   Comment:            The GeneXpert MRSA Assay (FDA     approved for NASAL specimens     only), is one component of a     comprehensive MRSA colonization     surveillance program. It is not     intended to diagnose MRSA     infection nor to guide or     monitor treatment for     MRSA infections.  URINE CULTURE     Status: None   Collection Time    04/30/13  2:59 PM      Result Value Ref Range Status   Specimen Description URINE, CLEAN CATCH   Final   Special Requests NONE   Final   Culture  Setup Time     Final   Value: 04/30/2013 20:05     Performed at [facility] Count     Final   Value: 5,000 COLONIES/ML     Performed at [facility]   Culture     Final   Value: INSIGNIFICANT GROWTH     Performed at [facility]   Report Status 05/01/2013 FINAL   Final     Labs: Basic Metabolic Panel:  Recent Labs Lab 04/26/13 0535  04/26/13 1933 04/27/13 0325 04/28/13 0605 04/30/13 0413 05/01/13 0422  NA 135*  < > 147 147 148* 145 140  K 4.1  < > 4.2 4.2 4.0 3.0* 3.2*  CL 101  < > 119* 119* 117* 108 102  CO2 25  < > 19 20  23 27 25   GLUCOSE 140*  < > 123* 145* 135* 121* 129*  BUN 7  < > 36* 30* 24* 5* 3*  CREATININE 0.88  < > 1.12 0.96 0.82 0.79 0.73  CALCIUM  8.3*  < > 6.7* 6.9* 7.8* 8.0* 8.6  MG 1.5  --   --  1.6  --   --   --   PHOS 2.9  --   --  2.7  --   --   --   < > = values in this interval not displayed. Liver Function Tests:  Recent Labs Lab 05/01/13 0422  AST 32  ALT 34  ALKPHOS 68  BILITOT 0.8  PROT 5.8*  ALBUMIN 2.6*   No results found for this basename: LIPASE, AMYLASE,  in the last 168 hours No results found for this basename: AMMONIA,  in the last 168 hours CBC:  Recent Labs Lab 04/26/13 0618 04/26/13 0755  04/27/13 0325 04/28/13 0605 04/30/13 0413 04/30/13 1545 05/01/13 0422 05/02/13 0341  WBC  --  11.2*  < > 21.3* 19.5* 17.3*  --  16.3* 12.5*  NEUTROABS  --  6.9  --   --   --   --   --   --   --   HGB 8.2* 11.8*  < > 9.3* 9.1* 8.7* 9.1* 9.7* 9.7*  HCT 24.0* 33.2*  < > 26.5* 26.8* 25.2* 26.4* 28.9* 29.2*  MCV  --  86.7  < > 87.5 91.2 91.3  --  92.3 93.0  PLT  --  22*  < > 61* 73* 155  --  217 280  < > = values in this interval not displayed. Cardiac Enzymes: No results found for this basename: CKTOTAL, CKMB, CKMBINDEX, TROPONINI,  in the last 168 hours BNP: BNP (last 3 results) No results found for this basename: PROBNP,  in the last 8760 hours CBG:  Recent Labs Lab 04/27/13 1516 04/28/13 0002 04/28/13 0358 04/28/13 1115 04/28/13 1515  GLUCAP 144* 135* 140* 128* 123*       Signed:  Penny PiaVEGA, Octavis Sheeler  [facility] 05/02/2013, 1:21 PM

## 2013-05-02 NOTE — Progress Notes (Signed)
Patient ID: Tyrone [name], male   DOB: March 03, 1956, 58 y.o.   MRN: [id number] 6 Days Post-Op  Subjective: Pt doing ok.  Has some pain with movement.  No nausea.  Tolerating a diet.  passing flatus.  Objective: Vital signs in last 24 hours: Temp:  [98 F (36.7 C)-98.1 F (36.7 C)] 98.1 F (36.7 C) (02/19 0410) Pulse Rate:  [79-97] 79 (02/19 0410) Resp:  [18] 18 (02/19 0410) BP: (143-154)/(68-87) 143/68 mmHg (02/19 0410) SpO2:  [97 %-100 %] 97 % (02/19 0410) Last BM Date: 05/01/13  Intake/Output from previous day: 02/18 0701 - 02/19 0700 In: 360 [P.O.:360] Out: 450 [Urine:450] Intake/Output this shift:    PE: Abd: soft, appropriately tender, incision c/d/i with staples, +BS, ND  Lab Results:   Recent Labs  05/01/13 0422 05/02/13 0341  WBC 16.3* 12.5*  HGB 9.7* 9.7*  HCT 28.9* 29.2*  PLT 217 280   BMET  Recent Labs  04/30/13 0413 05/01/13 0422  NA 145 140  K 3.0* 3.2*  CL 108 102  CO2 27 25  GLUCOSE 121* 129*  BUN 5* 3*  CREATININE 0.79 0.73  CALCIUM 8.0* 8.6   PT/INR No results found for this basename: LABPROT, INR,  in the last 72 hours CMP     Component Value Date/Time   NA 140 05/01/2013 0422   K 3.2* 05/01/2013 0422   CL 102 05/01/2013 0422   CO2 25 05/01/2013 0422   GLUCOSE 129* 05/01/2013 0422   BUN 3* 05/01/2013 0422   CREATININE 0.73 05/01/2013 0422   CALCIUM 8.6 05/01/2013 0422   PROT 5.8* 05/01/2013 0422   ALBUMIN 2.6* 05/01/2013 0422   AST 32 05/01/2013 0422   ALT 34 05/01/2013 0422   ALKPHOS 68 05/01/2013 0422   BILITOT 0.8 05/01/2013 0422   GFRNONAA >90 05/01/2013 0422   GFRAA >90 05/01/2013 0422   Lipase  No results found for this basename: lipase       Studies/Results: Dg Chest Port 1 View  04/30/2013   CLINICAL DATA:  Cough.  EXAM: PORTABLE CHEST - 1 VIEW  COMPARISON:  04/27/2013.  FINDINGS: The cardiac silhouette remains borderline enlarged. Increased prominence of the pulmonary vasculature and interstitial markings. Oral contrast in the  stomach and splenic flexure. Unremarkable bones.  IMPRESSION: Interval mild changes of congestive heart failure.   Electronically Signed   By: Gordan Payment M.D.   On: 04/30/2013 11:09    Anti-infectives: Anti-infectives   Start     Dose/Rate Route Frequency Ordered Stop   04/26/13 0600  [MAR Hold]  cefOXitin (MEFOXIN) 2 g in dextrose 5 % 50 mL IVPB     (On MAR Hold since 04/26/13 0512)   2 g 100 mL/hr over 30 Minutes Intravenous On call to O.R. 04/26/13 0425 04/26/13 0533       Assessment/Plan  1. POD 6 s/p ex lap with gastrotomy and oversew of gastric AVM  Plan: 1. Advance to soft diet 2. Ok for [employer] home from our standpoint once tolerates soft diet.  If he is still here tomorrow, due to social issues, it may be beneficial to attempt staple removal at that time, if appropriate.   LOS: 7 days    Reese Senk E 05/02/2013, 10:56 AM Pager: [phone]

## 2013-05-02 NOTE — Progress Notes (Addendum)
Update: CSW has called patient's apartment complex. Apartment complex states he can return to the apartment for the remainder of the week, but then needs to work with his caseworker to find somewhere else. CSW spoke with MD. MD states patient has capacity and is able to make their own decisions to go back to his apartment complex. Patient is in agreement to the plan and states he wants to go back to his apartment. CSW attempted to call patient's daughter to inform. CSW left voicemail. CSW spoke to patient's Case Manager in [place], [place] who states he will figure out a plan with patient, when he gets back to his apartment and to have the patient call him as soon as he gets back. CSW set up [employer], who states they will be here around 3pm. Number is [phone].   CSW went by patient's room to speak to patient about dc plan. Patient states he has an apartment to go to in [place], [place] and if he doesn't pay his rent by March 3, he will not have an apartment. Patient gave permission to speak with daughter to confirm that patient in fact does have an apartment. CSW spoke with daughter and daughter confirmed that patient does have an apartment. CSW notified MD.   Maree Krabbe, MSW, Theresia Majors [phone]

## 2013-05-02 NOTE — Progress Notes (Signed)
Seen and agree Waiting for safe place to discharge him to

## 2013-05-07 ENCOUNTER — Encounter (INDEPENDENT_AMBULATORY_CARE_PROVIDER_SITE_OTHER): Payer: Medicare Other

## 2013-05-18 ENCOUNTER — Inpatient Hospital Stay (HOSPITAL_COMMUNITY)
Admission: AD | Admit: 2013-05-18 | Discharge: 2013-05-21 | DRG: 862 | Disposition: A | Discharge status: Home Health | Payer: Medicare Other | Source: Other Acute Inpatient Hospital | Attending: Internal Medicine | Admitting: Internal Medicine

## 2013-05-18 ENCOUNTER — Encounter (HOSPITAL_COMMUNITY): Payer: Self-pay | Admitting: *Deleted

## 2013-05-18 DIAGNOSIS — F1010 Alcohol abuse, uncomplicated: Secondary | ICD-10-CM | POA: Diagnosis present

## 2013-05-18 DIAGNOSIS — F1910 Other psychoactive substance abuse, uncomplicated: Secondary | ICD-10-CM

## 2013-05-18 DIAGNOSIS — Z79899 Other long term (current) drug therapy: Secondary | ICD-10-CM

## 2013-05-18 DIAGNOSIS — E860 Dehydration: Secondary | ICD-10-CM | POA: Diagnosis present

## 2013-05-18 DIAGNOSIS — S069XAA Unspecified intracranial injury with loss of consciousness status unknown, initial encounter: Secondary | ICD-10-CM

## 2013-05-18 DIAGNOSIS — L02219 Cutaneous abscess of trunk, unspecified: Secondary | ICD-10-CM | POA: Diagnosis present

## 2013-05-18 DIAGNOSIS — Z9861 Coronary angioplasty status: Secondary | ICD-10-CM

## 2013-05-18 DIAGNOSIS — T8149XA Infection following a procedure, other surgical site, initial encounter: Secondary | ICD-10-CM

## 2013-05-18 DIAGNOSIS — J9600 Acute respiratory failure, unspecified whether with hypoxia or hypercapnia: Secondary | ICD-10-CM

## 2013-05-18 DIAGNOSIS — L03319 Cellulitis of trunk, unspecified: Secondary | ICD-10-CM

## 2013-05-18 DIAGNOSIS — K922 Gastrointestinal hemorrhage, unspecified: Secondary | ICD-10-CM

## 2013-05-18 DIAGNOSIS — Z91199 Patient's noncompliance with other medical treatment and regimen due to unspecified reason: Secondary | ICD-10-CM

## 2013-05-18 DIAGNOSIS — R578 Other shock: Secondary | ICD-10-CM

## 2013-05-18 DIAGNOSIS — Z87820 Personal history of traumatic brain injury: Secondary | ICD-10-CM

## 2013-05-18 DIAGNOSIS — D62 Acute posthemorrhagic anemia: Secondary | ICD-10-CM

## 2013-05-18 DIAGNOSIS — I252 Old myocardial infarction: Secondary | ICD-10-CM

## 2013-05-18 DIAGNOSIS — J441 Chronic obstructive pulmonary disease with (acute) exacerbation: Secondary | ICD-10-CM

## 2013-05-18 DIAGNOSIS — F319 Bipolar disorder, unspecified: Secondary | ICD-10-CM

## 2013-05-18 DIAGNOSIS — R209 Unspecified disturbances of skin sensation: Secondary | ICD-10-CM | POA: Diagnosis present

## 2013-05-18 DIAGNOSIS — D72829 Elevated white blood cell count, unspecified: Secondary | ICD-10-CM | POA: Diagnosis present

## 2013-05-18 DIAGNOSIS — Z9119 Patient's noncompliance with other medical treatment and regimen: Secondary | ICD-10-CM

## 2013-05-18 DIAGNOSIS — F17200 Nicotine dependence, unspecified, uncomplicated: Secondary | ICD-10-CM

## 2013-05-18 DIAGNOSIS — Z888 Allergy status to other drugs, medicaments and biological substances status: Secondary | ICD-10-CM

## 2013-05-18 DIAGNOSIS — S069X9A Unspecified intracranial injury with loss of consciousness of unspecified duration, initial encounter: Secondary | ICD-10-CM

## 2013-05-18 DIAGNOSIS — B1920 Unspecified viral hepatitis C without hepatic coma: Secondary | ICD-10-CM

## 2013-05-18 DIAGNOSIS — I25810 Atherosclerosis of coronary artery bypass graft(s) without angina pectoris: Secondary | ICD-10-CM

## 2013-05-18 DIAGNOSIS — J449 Chronic obstructive pulmonary disease, unspecified: Secondary | ICD-10-CM

## 2013-05-18 DIAGNOSIS — J4489 Other specified chronic obstructive pulmonary disease: Secondary | ICD-10-CM | POA: Diagnosis present

## 2013-05-18 DIAGNOSIS — I2589 Other forms of chronic ischemic heart disease: Secondary | ICD-10-CM | POA: Diagnosis present

## 2013-05-18 DIAGNOSIS — Y838 Other surgical procedures as the cause of abnormal reaction of the patient, or of later complication, without mention of misadventure at the time of the procedure: Secondary | ICD-10-CM | POA: Diagnosis present

## 2013-05-18 DIAGNOSIS — D649 Anemia, unspecified: Secondary | ICD-10-CM

## 2013-05-18 DIAGNOSIS — E43 Unspecified severe protein-calorie malnutrition: Secondary | ICD-10-CM

## 2013-05-18 DIAGNOSIS — I10 Essential (primary) hypertension: Secondary | ICD-10-CM

## 2013-05-18 DIAGNOSIS — F259 Schizoaffective disorder, unspecified: Secondary | ICD-10-CM

## 2013-05-18 DIAGNOSIS — F411 Generalized anxiety disorder: Secondary | ICD-10-CM | POA: Diagnosis present

## 2013-05-18 DIAGNOSIS — T8140XA Infection following a procedure, unspecified, initial encounter: Principal | ICD-10-CM | POA: Diagnosis present

## 2013-05-18 DIAGNOSIS — I2510 Atherosclerotic heart disease of native coronary artery without angina pectoris: Secondary | ICD-10-CM | POA: Diagnosis present

## 2013-05-18 DIAGNOSIS — Z8673 Personal history of transient ischemic attack (TIA), and cerebral infarction without residual deficits: Secondary | ICD-10-CM

## 2013-05-18 HISTORY — DX: Unspecified viral hepatitis C without hepatic coma: B19.20

## 2013-05-18 HISTORY — DX: Unspecified intracranial injury with loss of consciousness status unknown, initial encounter: S06.9XAA

## 2013-05-18 HISTORY — DX: Gastrointestinal hemorrhage, unspecified: K92.2

## 2013-05-18 HISTORY — DX: Schizoaffective disorder, unspecified: F25.9

## 2013-05-18 HISTORY — DX: Ischemic cardiomyopathy: I25.5

## 2013-05-18 HISTORY — DX: Unspecified intracranial injury with loss of consciousness of unspecified duration, initial encounter: S06.9X9A

## 2013-05-18 HISTORY — DX: Alcohol abuse, uncomplicated: F10.10

## 2013-05-18 HISTORY — DX: Bipolar disorder, unspecified: F31.9

## 2013-05-18 HISTORY — DX: Other psychoactive substance abuse, uncomplicated: F19.10

## 2013-05-18 HISTORY — DX: Cerebral infarction, unspecified: I63.9

## 2013-05-18 HISTORY — DX: Chronic obstructive pulmonary disease, unspecified: J44.9

## 2013-05-18 HISTORY — DX: Essential (primary) hypertension: I10

## 2013-05-18 MED ORDER — THIAMINE HCL 100 MG/ML IJ SOLN
100.0000 mg | Freq: Every day | INTRAMUSCULAR | Status: DC
  Filled 2013-05-18 (×2): qty 1

## 2013-05-18 MED ORDER — HYDROCODONE-ACETAMINOPHEN 5-325 MG PO TABS
1.0000 | ORAL_TABLET | Freq: Four times a day (QID) | ORAL | Status: DC | PRN
  Administered 2013-05-18 – 2013-05-19 (×5): 2 via ORAL
  Administered 2013-05-20: 1 via ORAL
  Administered 2013-05-20 (×2): 2 via ORAL
  Filled 2013-05-18 (×7): qty 2

## 2013-05-18 MED ORDER — OXYCODONE HCL 5 MG PO TABS: 5.0000 mg | ORAL_TABLET | ORAL | Status: DC | PRN

## 2013-05-18 MED ORDER — ALBUTEROL SULFATE (2.5 MG/3ML) 0.083% IN NEBU: 2.5000 mg | INHALATION_SOLUTION | RESPIRATORY_TRACT | Status: DC | PRN

## 2013-05-18 MED ORDER — LORAZEPAM 1 MG PO TABS: 1.0000 mg | ORAL_TABLET | Freq: Four times a day (QID) | ORAL | Status: DC | PRN

## 2013-05-18 MED ORDER — ONDANSETRON HCL 4 MG/2ML IJ SOLN: 4.0000 mg | Freq: Four times a day (QID) | INTRAMUSCULAR | Status: DC | PRN

## 2013-05-18 MED ORDER — ACETAMINOPHEN 325 MG PO TABS
650.0000 mg | ORAL_TABLET | Freq: Four times a day (QID) | ORAL | Status: DC | PRN
  Administered 2013-05-19: 650 mg via ORAL
  Filled 2013-05-18: qty 2

## 2013-05-18 MED ORDER — METOPROLOL TARTRATE 25 MG PO TABS
25.0000 mg | ORAL_TABLET | Freq: Two times a day (BID) | ORAL | Status: DC
  Administered 2013-05-18 – 2013-05-21 (×6): 25 mg via ORAL
  Filled 2013-05-18 (×10): qty 1

## 2013-05-18 MED ORDER — LORAZEPAM 2 MG/ML IJ SOLN
1.0000 mg | Freq: Four times a day (QID) | INTRAMUSCULAR | Status: DC | PRN
Start: 2013-05-18 — End: 2013-05-19
  Administered 2013-05-18 – 2013-05-19 (×2): 1 mg via INTRAVENOUS
  Filled 2013-05-18 (×2): qty 1

## 2013-05-18 MED ORDER — ONDANSETRON HCL 4 MG PO TABS: 4.0000 mg | ORAL_TABLET | Freq: Four times a day (QID) | ORAL | Status: DC | PRN

## 2013-05-18 MED ORDER — ACETAMINOPHEN 650 MG RE SUPP: 650.0000 mg | Freq: Four times a day (QID) | RECTAL | Status: DC | PRN

## 2013-05-18 MED ORDER — FOLIC ACID 1 MG PO TABS
1.0000 mg | ORAL_TABLET | Freq: Every day | ORAL | Status: DC
  Administered 2013-05-19 – 2013-05-21 (×3): 1 mg via ORAL
  Filled 2013-05-18 (×4): qty 1

## 2013-05-18 MED ORDER — VANCOMYCIN HCL IN DEXTROSE 1-5 GM/200ML-% IV SOLN
1000.0000 mg | Freq: Two times a day (BID) | INTRAVENOUS | Status: DC
  Administered 2013-05-18: 1000 mg via INTRAVENOUS
  Filled 2013-05-18 (×2): qty 200

## 2013-05-18 MED ORDER — VITAMIN B-1 100 MG PO TABS
100.0000 mg | ORAL_TABLET | Freq: Every day | ORAL | Status: DC
  Administered 2013-05-19 – 2013-05-21 (×3): 100 mg via ORAL
  Filled 2013-05-18 (×4): qty 1

## 2013-05-18 MED ORDER — PANTOPRAZOLE SODIUM 40 MG IV SOLR
40.0000 mg | INTRAVENOUS | Status: DC
  Administered 2013-05-18 – 2013-05-19 (×2): 40 mg via INTRAVENOUS
  Filled 2013-05-18 (×3): qty 40

## 2013-05-18 MED ORDER — POTASSIUM CHLORIDE IN NACL 20-0.9 MEQ/L-% IV SOLN
INTRAVENOUS | Status: AC
  Administered 2013-05-18: 18:00:00 via INTRAVENOUS
  Filled 2013-05-18 (×2): qty 1000

## 2013-05-18 MED ORDER — ADULT MULTIVITAMIN W/MINERALS CH
1.0000 | ORAL_TABLET | Freq: Every day | ORAL | Status: DC
  Administered 2013-05-19 – 2013-05-21 (×3): 1 via ORAL
  Filled 2013-05-18 (×4): qty 1

## 2013-05-18 MED ORDER — PIPERACILLIN-TAZOBACTAM 3.375 G IVPB
3.3750 g | Freq: Three times a day (TID) | INTRAVENOUS | Status: DC
  Administered 2013-05-18 – 2013-05-21 (×9): 3.375 g via INTRAVENOUS
  Filled 2013-05-18 (×11): qty 50

## 2013-05-18 MED ORDER — HYDROMORPHONE HCL PF 1 MG/ML IJ SOLN
0.5000 mg | INTRAMUSCULAR | Status: DC | PRN
  Administered 2013-05-18 – 2013-05-20 (×11): 0.5 mg via INTRAVENOUS
  Filled 2013-05-18 (×12): qty 1

## 2013-05-18 NOTE — Consult Note (Signed)
Midline wound infection with oily bloody fluid collection.  Cultures sent.  Wet to dry dressing started.  No dehiscence noted but some loose fascial sutures.    Tyrone [name]. Gae Bon, MD, FACS [phone] [facility]

## 2013-05-18 NOTE — Progress Notes (Addendum)
ANTIBIOTIC CONSULT NOTE - INITIAL  Pharmacy Consult for vancomycin Indication: wound infection  Allergies  Allergen Reactions  . Nitroglycerin Nausea And Vomiting  . Toradol [Ketorolac Tromethamine] Itching  . Ultram [Tramadol] Itching    Patient Measurements:   Wt Readings from Last 3 Encounters:  04/27/13 171 lb 1.2 oz (77.6 kg)  04/27/13 171 lb 1.2 oz (77.6 kg)  04/27/13 171 lb 1.2 oz (77.6 kg)   Vital Signs:    Labs: No results found for this basename: WBC, HGB, PLT, LABCREA, CREATININE,  in the last 72 hours The CrCl is unknown because both a height and weight (above a minimum accepted value) are required for this calculation. No results found for this basename: VANCOTROUGH, Leodis BinetVANCOPEAK, VANCORANDOM, GENTTROUGH, GENTPEAK, GENTRANDOM, TOBRATROUGH, TOBRAPEAK, TOBRARND, AMIKACINPEAK, AMIKACINTROU, AMIKACIN,  in the last 72 hours   Microbiology: Recent Results (from the past 720 hour(s))  MRSA PCR SCREENING     Status: None   Collection Time    04/25/13  8:40 AM      Result Value Ref Range Status   MRSA by PCR NEGATIVE  NEGATIVE Final   Comment:            The GeneXpert MRSA Assay (FDA     approved for NASAL specimens     only), is one component of a     comprehensive MRSA colonization     surveillance program. It is not     intended to diagnose MRSA     infection nor to guide or     monitor treatment for     MRSA infections.  MRSA PCR SCREENING     Status: None   Collection Time    04/25/13 11:35 PM      Result Value Ref Range Status   MRSA by PCR NEGATIVE  NEGATIVE Final   Comment:            The GeneXpert MRSA Assay (FDA     approved for NASAL specimens     only), is one component of a     comprehensive MRSA colonization     surveillance program. It is not     intended to diagnose MRSA     infection nor to guide or     monitor treatment for     MRSA infections.  URINE CULTURE     Status: None   Collection Time    04/30/13  2:59 PM      Result Value Ref  Range Status   Specimen Description URINE, CLEAN CATCH   Final   Special Requests NONE   Final   Culture  Setup Time     Final   Value: 04/30/2013 20:05     Performed at [facility]   Colony Count     Final   Value: 5,000 COLONIES/ML     Performed at [facility]   Culture     Final   Value: INSIGNIFICANT GROWTH     Performed at [facility]   Report Status 05/01/2013 FINAL   Final    Medical History: Past Medical History  Diagnosis Date  . Myocardial infarction     2008  . Polysubstance abuse   . Alcohol abuse   . CVA (cerebral infarction)   . HTN (hypertension)   . Hepatitis C   . COPD (chronic obstructive pulmonary disease)   . Ischemic cardiomyopathy   . Schizoaffective disorder   . Bipolar disorder   . TBI (traumatic brain injury)   .  GI bleed     Medications:  Anti-infectives   None     Assessment: 58 year old male with an abdominal wall abscess at his exploratory laparatomy site.  He is to begin empiric therapy with Vancomycin.  His SCr is 0.8 from his labs at [facility]  Goal of Therapy:  Vancomycin trough level 10-15 mcg/ml  Plan:  Vancomycin 1gm IV q12h Monitor renal function closely  Estella Husk, Pharm.D., BCPS, AAHIVP Clinical Pharmacist Phone: [phone] or [phone] 05/18/2013, 5:25 PM  Update Pt will also be starting Zosyn 3.375 mg IV q8h (4 hour infusion)  Agapito Games, PharmD, BCPS Clinical Pharmacist Pager: [phone] 05/18/2013 6:38 PM

## 2013-05-18 NOTE — H&P (Signed)
History and Physical  Tyrone [name] ZOX:[id number] DOB: 1955/12/21 DOA: 05/18/2013  Referring physician: Dr. Leanora Cover [place] at [facility] PCP: No primary provider on file.  Outpatient Specialists:  1. None  Chief Complaint: Abdominal pain, intermittent nosebleeds.  HPI: Tyrone [name] is a 58 y.o. male with history of alcohol and polysubstance abuse, CVA, hypertension, hepatitis C, COPD, chronic leukocytosis, ischemic cardiomyopathy, schizoaffective disorder, bipolar disorder, depression, anxiety, TBI status post craniotomy, GI bleed, hospitalized at [facility] from 2/12-2/19 for upper GI bleed, hemorrhagic shock, acute post hemorrhagic anemia, acute respiratory failure. He underwent exploratory laparotomy with gastrotomy and oversew of gastric AVM on 2/13. He was transferred from [facility] secondary to abdominal pain, anterior abdominal wall abscess seen on CT and? Hematemesis? Pancreatitis. Patient is a very poor historian and poor reliability, secondary to underlying psychiatric condition and TBI. He states that he does not even remember much of his hospitalization at [facility]. He claims he was discharged without followup or medications. Since discharge he has not taken any medications or seen any M.D. until today. He states that he's had ongoing anterior abdominal wall pain at the site of surgery which progressively gotten worse in the last couple of days. Pain is nonradiating and worse with coughing and movement. He states that he has had nausea and intermittent vomiting without blood or coffee grounds. He also repeatedly states that he has not had anything to eat or drink for 10 days. He has a watery BM daily and the last one was yesterday. He gives history of intermittent epistaxis and some hematuria. Positive for chills but no fevers. He denies chest pain, dyspnea or cough. No dysuria. He denies suicidal or homicidal ideations,  delusions or hallucinations. He also complains of left hand numbness and dropping objects frequently since surgery in February. He was transferred for further management.   Review of Systems: All systems reviewed and apart from history of presenting illness, are negative.  Past Medical History  Diagnosis Date  . Myocardial infarction     2008  . Polysubstance abuse   . Alcohol abuse   . CVA (cerebral infarction)   . HTN (hypertension)   . Hepatitis C   . COPD (chronic obstructive pulmonary disease)   . Ischemic cardiomyopathy   . Schizoaffective disorder   . Bipolar disorder   . TBI (traumatic brain injury)   . GI bleed    Past Surgical History  Procedure Laterality Date  . Splenectomy    . Coronary angioplasty with stent placement    . Esophagogastroduodenoscopy N/A 04/26/2013    Procedure: ESOPHAGOGASTRODUODENOSCOPY (EGD);  Surgeon: Graylin Shiver, MD;  Location: [facility] ENDOSCOPY;  Service: Endoscopy;  Laterality: N/A;  . Laparotomy N/A 04/26/2013    Procedure: EXPLORATORY LAPAROTOMY, OVERSEW OF GASTRIC AV MALFORMATION, REPAIR OF GASTROTOMY;  Surgeon: Cherylynn Ridges, MD;  Location: [facility] OR;  Service: General;  Laterality: N/A;  . Craniotomy     Social History:  reports that he has been smoking Cigarettes.  He has a 40 pack-year smoking history. He does not have any smokeless tobacco history on file. He reports that he does not drink alcohol or use illicit drugs. Single. Lives by himself at an apartment in [place], [place]. Independent of activities of daily living. States that he smokes one cigarette per week. Claims that he has not drunk alcohol in 40 years. He states that he last smoked pot was 3 weeks ago. Denies any other substance abuse.  Allergies  Allergen Reactions  . Nitroglycerin Nausea And Vomiting  . Toradol [Ketorolac Tromethamine] Itching  . Ultram [Tramadol] Itching    No family history on file. unable to elicit family history.  Prior to Admission medications     Medication Sig Start Date End Date Taking? Authorizing Provider  HYDROcodone-acetaminophen (NORCO/VICODIN) 5-325 MG per tablet Take 1 tablet by mouth every 6 (six) hours as needed for moderate pain. 05/02/13   Penny Pia, MD  metoprolol (LOPRESSOR) 50 MG tablet Take 1 tablet (50 mg total) by mouth 2 (two) times daily. 05/02/13   Penny Pia, MD  OLANZapine zydis (ZYPREXA) 5 MG disintegrating tablet Take 1 tablet (5 mg total) by mouth at bedtime. 05/02/13   Penny Pia, MD   Physical Exam: There were no vitals filed for this visit.   General exam: Moderately built and nourished male patient, disheveled, ambulating steadily and comfortably in the room.   Head, eyes and ENT: Nontraumatic and normocephalic. Pupils equally reacting to light and accommodation. Oral mucosa mildly dry in no acute findings or no blood in the posterior pharyngeal wall. No features to suggest epistaxis at this time.  Neck: Supple. No JVD, carotid bruit or thyromegaly.  Lymphatics: No lymphadenopathy.  Respiratory system: Clear to auscultation. No increased work of breathing.  Cardiovascular system: S1 and S2 heard, RRR. No JVD, murmurs, gallops, clicks or pedal edema.  Gastrointestinal system: Abdomen is nondistended and soft. Midline laparotomy with staples still intact. Mild erythema at the laparotomy line. Diffuse non-discrete approximately 3 x 3 cm subcutaneous tender nonfluctuant swelling under the mid aspect of the laparotomy. Normal bowel sounds heard. No drainage. No organomegaly or masses appreciated. Normal bowel sounds heard.  Central nervous system: Alert and oriented. No focal neurological deficits.  Extremities: Symmetric 5 x 5 power. Peripheral pulses symmetrically felt.   Skin: No rashes or acute findings.  Musculoskeletal system: Negative exam.  Psychiatry: Pleasant and cooperative.   Labs on Admission: Reviewed from outside hospital 1. EKG: Normal sinus rhythm at 72 beats per minute,  normal axis and no acute changes. QTC 397 ms. 2. CT head without contrast: Stable chronic changes without acute findings  3. CT abdomen and pelvis with contrast: Superficial abscess involving the overlying the abdominal wall immediately deep to the skin staples which measures 3.6 in its transverse by 2.3 AP by 7.9 cm craniocaudal, colonic diverticulosis without diverticulitis. 4. Portable chest x-ray: No acute cardiopulmonary disease. 5. CMP: Sodium 139, potassium 3.6, chloride 106, bicarbonate 27, BUN 7, creatinine 0.8, glucose 110, calcium 8.6, total bilirubin 0.7, AST 51, ALT 32, alkaline phosphatase 95, total protein 6.3 and albumin 3.2 6. Lactic acid 1.5 7. Cardiac panel: CK 71, CK-MB 0.8, troponin I <0.015 8. Lipase: 507 (73-393) 9. PT 10.9, INR 1, PTT 31 10. CBC: Hemoglobin 11.1, hematocrit 33.7, WBC 11.68 and platelets 357 11. Urine drug screen: Positive for cannabinoids 12. Urine microscopy: Negative  Assessment/Plan Principal Problem:   Abdominal wall abscess at site of surgical wound Active Problems:   Anemia   Tobacco use disorder   Polysubstance abuse   Alcohol abuse   HTN (hypertension)   Hepatitis C   COPD (chronic obstructive pulmonary disease)   Schizoaffective disorder   Bipolar disorder   1. Abdominal wall abscess at site of surgical wound: At [facility], patient was treated with IV fluids, IV levofloxacin and IV Dilaudid. Admitted to medical floor. Surgeon's consulted. Keep n.p.o. for possible surgical intervention. IV vancomycin per pharmacy. Continue IV fluids and pain management. 2. Mild  dehydration: Brief IV fluids. 3. Uncontrolled hypertension: Secondary to noncompliance. Resume metoprolol 25 mg by mouth twice a day 4. Left hand numbness: Unclear etiology. States that he has had this symptom since discharge. No obvious focal deficits. CT head negative. May consider MRI after acute events have been addressed. 5. History of polysubstance abuse: Patient  denies alcohol use. CIWA protocol. Not sure if he will comprehend cessation counseling at this time. 6. History of schizoaffective disorder/bipolar disorder/TBI/prior CVA: It appears as though patient has not taken any medications since discharge-was supposedly discharged on Zyprexa 5 mg daily. For now monitor off medications. Patient is calm and cooperative. 7. Elevated lipase: Unclear significance. Less likely to be pancreatitis but when surgeon's clear for by mouth intake, start with clear liquids and monitor. Follow lipase in a.m. 8. Intermittent epistaxis: None currently. Monitor. 9. Anemia: Follow CBCs 10. COPD: Stable 11. History of CAD/ischemic cardiomyopathy: Asymptomatic. EKG without acute changes and troponin x1 negative. 12. History of hepatitis C: 13. Reported hematuria: Urine microscopy negative for blood.      Code Status: Full  Family Communication: None at bedside.  Disposition Plan: Home when medically stable.   Time spent: 60 minutes  Amera Banos, MD, FACP, FHM. [facility] Pager [phone]  If 7PM-7AM, please contact night-coverage [email or web address] Password [email or web address] 05/18/2013, 5:25 PM

## 2013-05-19 DIAGNOSIS — L03319 Cellulitis of trunk, unspecified: Secondary | ICD-10-CM

## 2013-05-19 DIAGNOSIS — F1010 Alcohol abuse, uncomplicated: Secondary | ICD-10-CM

## 2013-05-19 DIAGNOSIS — D649 Anemia, unspecified: Secondary | ICD-10-CM

## 2013-05-19 DIAGNOSIS — L02219 Cutaneous abscess of trunk, unspecified: Secondary | ICD-10-CM

## 2013-05-19 LAB — COMPREHENSIVE METABOLIC PANEL
ALK PHOS: 68 U/L (ref 39–117)
ALT: 21 U/L (ref 0–53)
AST: 37 U/L (ref 0–37)
Albumin: 2.9 g/dL — ABNORMAL LOW (ref 3.5–5.2)
BILIRUBIN TOTAL: 0.6 mg/dL (ref 0.3–1.2)
BUN: 6 mg/dL (ref 6–23)
CHLORIDE: 99 meq/L (ref 96–112)
CO2: 26 mEq/L (ref 19–32)
Calcium: 8.7 mg/dL (ref 8.4–10.5)
Creatinine, Ser: 0.8 mg/dL (ref 0.50–1.35)
GLUCOSE: 107 mg/dL — AB (ref 70–99)
Potassium: 3.9 mEq/L (ref 3.7–5.3)
Sodium: 136 mEq/L — ABNORMAL LOW (ref 137–147)
Total Protein: 5.7 g/dL — ABNORMAL LOW (ref 6.0–8.3)

## 2013-05-19 LAB — CBC
HCT: 30.6 % — ABNORMAL LOW (ref 39.0–52.0)
Hemoglobin: 9.9 g/dL — ABNORMAL LOW (ref 13.0–17.0)
MCH: 30.8 pg (ref 26.0–34.0)
MCHC: 32.4 g/dL (ref 30.0–36.0)
MCV: 95.3 fL (ref 78.0–100.0)
PLATELETS: 313 10*3/uL (ref 150–400)
RBC: 3.21 MIL/uL — ABNORMAL LOW (ref 4.22–5.81)
RDW: 16.3 % — AB (ref 11.5–15.5)
WBC: 10.8 10*3/uL — AB (ref 4.0–10.5)

## 2013-05-19 LAB — LIPASE, BLOOD: LIPASE: 79 U/L — AB (ref 11–59)

## 2013-05-19 MED ORDER — ALPRAZOLAM 0.5 MG PO TABS
1.0000 mg | ORAL_TABLET | Freq: Three times a day (TID) | ORAL | Status: DC
  Administered 2013-05-19 – 2013-05-21 (×7): 1 mg via ORAL
  Filled 2013-05-19 (×7): qty 2

## 2013-05-19 NOTE — Progress Notes (Signed)
General Surgery Note  LOS: 1 day  POD -     Assessment/Plan: 1.  EXPLORATORY LAPAROTOMY, OVERSEW OF GASTRIC AV MALFORMATION, REPAIR OF GASTROTOMY - 04/26/2013 - J. Wyatt  2.  Superficial abdominal wound abscess  On Zosyn  It primarily needs dressing changes.  Will advance diet. 3.  History of alcohol and polysubstance abuse 4.  Hep C 5.  COPD 6.  Schizoaffective disorder/bipolar 7.  Hgb - 9.9 - 05/19/2013   Principal Problem:   Abdominal wall abscess at site of surgical wound Active Problems:   Anemia   Tobacco use disorder   Polysubstance abuse   Alcohol abuse   HTN (hypertension)   Hepatitis C   COPD (chronic obstructive pulmonary disease)   Schizoaffective disorder   Bipolar disorder   Subjective:  Doing okay.  Does not remember much.  Wants more pain med, but I would limit him to what he is on. Objective:   Filed Vitals:   05/19/13 0619  BP: 159/92  Pulse: 60  Temp: 98 F (36.7 C)  Resp: 18     Intake/Output from previous day:  03/07 0701 - 03/08 0700 In: 1946.3 [P.O.:960; I.V.:886.3; IV Piggyback:100] Out: -   Intake/Output this shift:      Physical Exam:   General: Older WM who is alert and oriented. But very poor historian.   HEENT: Normal. Pupils equal. .   Abdomen: Soft.  BS present.   Wound: I opened wound up more.  It may need further debridement, but is clean right now.  I opened wound up more.      Lab Results:    Recent Labs  05/19/13 0659  WBC 10.8*  HGB 9.9*  HCT 30.6*  PLT 313    BMET   Recent Labs  05/19/13 0659  NA 136*  K 3.9  CL 99  CO2 26  GLUCOSE 107*  BUN 6  CREATININE 0.80  CALCIUM 8.7    PT/INR  No results found for this basename: LABPROT, INR,  in the last 72 hours  ABG  No results found for this basename: PHART, PCO2, PO2, HCO3,  in the last 72 hours   Studies/Results:  No results found.   Anti-infectives:   Anti-infectives   Start     Dose/Rate Route Frequency Ordered Stop   05/18/13 1930   piperacillin-tazobactam (ZOSYN) IVPB 3.375 g     3.375 g 12.5 mL/hr over 240 Minutes Intravenous 3 times per day 05/18/13 1836     05/18/13 1830  vancomycin (VANCOCIN) IVPB 1000 mg/200 mL premix  Status:  Discontinued     1,000 mg 200 mL/hr over 60 Minutes Intravenous Every 12 hours 05/18/13 1748 05/18/13 1834      Ovidio Kin, MD, FACS Pager: [phone] [facility] Office: [phone] 05/19/2013

## 2013-05-19 NOTE — Progress Notes (Addendum)
[facility] PROGRESS NOTE  Tyrone ElmJohn [name] ZOX:[id number] DOB: [birth year] DOA: 05/18/2013 PCP: No primary provider on file.  Assessment/Plan: 1.Abdominal wall abscess at site of surgical wound: At [facility], patient was treated with IV fluids, IV levofloxacin and IV Dilaudid. -now on zosyn -incision opened up per surgery, appreciate assistance -continue local wound care -continue current pain management, surgery not recommending escalation 2.Mild dehydration:  -improved with hydration. 3.Uncontrolled hypertension:  -Secondary to noncompliance.  -metoprolol 25 mg by mouth twice a day 4.Left hand numbness:  Unclear etiology. States that he has had this symptom since discharge.CT head negative.  -PE non focal 5.History of polysubstance abuse: Patient denies alcohol use and has no signs of withdrawal at this time  -will d/c ativan detox protocol, and resame his outpt xanax and follow 6.History of schizoaffective disorder/bipolar disorder/TBI/prior CVA: It appears as though patient has not taken any medications since discharge-was supposedly discharged on Zyprexa 5 mg daily. For now monitor off medications. -he states has been taking xanax, will resume 7.Elevated lipase: Unclear significance. -started on regular diet per surgery -will follow for tolerance and recheck lipase in am 8.Intermittent epistaxis: None currently. Monitor. 9.Anemia: Follow CBCs 10.COPD: Stable 11.History of CAD/ischemic cardiomyopathy: Asymptomatic. EKG without acute changes and troponin x1 negative. 12.History of hepatitis C: 13.Reported hematuria: Urine microscopy negative for blood.   Code Status: full Family Communication: none at bedside  Disposition Plan: to home when medically ready   Consultants:  surgery  Procedures:  none  Antibiotics:  Zosyn started 3/7  HPI/Subjective: Pt states he has not drank any alcohol in many years, requesting his oupt xanax instead of ativan. C/o  pain.  Objective: Filed Vitals:   05/19/13 1339  BP: 152/88  Pulse: 66  Temp: 98.6 F (37 C)  Resp: 16    Intake/Output Summary (Last 24 hours) at 05/19/13 1853 Last data filed at 05/19/13 1828  Gross per 24 hour  Intake 3626.25 ml  Output      0 ml  Net 3626.25 ml   Filed Weights   05/19/13 0619  Weight: 64.91 kg (143 lb 1.6 oz)    Exam:  General: alert & oriented x 3 In NAD Cardiovascular: RRR, nl S1 s2 Respiratory: CTAB Abdomen: soft +BS NT/ND, abd wound with dressing clean and dry.no masses palpable Extremities: No cyanosis and no edema     Data Reviewed: Basic Metabolic Panel:  Recent Labs Lab 05/19/13 0659  NA 136*  K 3.9  CL 99  CO2 26  GLUCOSE 107*  BUN 6  CREATININE 0.80  CALCIUM 8.7   Liver Function Tests:  Recent Labs Lab 05/19/13 0659  AST 37  ALT 21  ALKPHOS 68  BILITOT 0.6  PROT 5.7*  ALBUMIN 2.9*    Recent Labs Lab 05/19/13 0659  LIPASE 79*   No results found for this basename: AMMONIA,  in the last 168 hours CBC:  Recent Labs Lab 05/19/13 0659  WBC 10.8*  HGB 9.9*  HCT 30.6*  MCV 95.3  PLT 313   Cardiac Enzymes: No results found for this basename: CKTOTAL, CKMB, CKMBINDEX, TROPONINI,  in the last 168 hours BNP (last 3 results) No results found for this basename: PROBNP,  in the last 8760 hours CBG: No results found for this basename: GLUCAP,  in the last 168 hours  No results found for this or any previous visit (from the past 240 hour(s)).   Studies: No results found.  Scheduled Meds: . ALPRAZolam  1 mg Oral TID  .  folic acid  1 mg Oral Daily  . metoprolol tartrate  25 mg Oral BID  . multivitamin with minerals  1 tablet Oral Daily  . pantoprazole (PROTONIX) IV  40 mg Intravenous Q24H  . piperacillin-tazobactam (ZOSYN)  IV  3.375 g Intravenous 3 times per day  . thiamine  100 mg Oral Daily   Or  . thiamine  100 mg Intravenous Daily   Continuous Infusions:   Principal Problem:   Abdominal wall  abscess at site of surgical wound Active Problems:   Anemia   Tobacco use disorder   Polysubstance abuse   Alcohol abuse   HTN (hypertension)   Hepatitis C   COPD (chronic obstructive pulmonary disease)   Schizoaffective disorder   Bipolar disorder    Time spent: 49    [facility]  [facility] Pager [phone]. If 7PM-7AM, please contact night-coverage at [email or web address], password [email or web address] 05/19/2013, 6:53 PM  LOS: 1 day

## 2013-05-19 NOTE — Progress Notes (Signed)
Patient continues to be very agitated/anxious.  He is pacing the department and even has left the unit without permission.  Security even had to escort him back to our unit once!  RN continues to try to calm him down.  But he is just not happy about not getting his Xanax like he gets at home and he continues to rate his pain 10/10 with no relief from meds we are giving.  I've spoken with Dr Ezzard Standing and Dr Suanne Marker today about the patient.  Will cont to monitor.

## 2013-05-20 DIAGNOSIS — F1910 Other psychoactive substance abuse, uncomplicated: Secondary | ICD-10-CM

## 2013-05-20 DIAGNOSIS — F319 Bipolar disorder, unspecified: Secondary | ICD-10-CM

## 2013-05-20 DIAGNOSIS — I10 Essential (primary) hypertension: Secondary | ICD-10-CM

## 2013-05-20 DIAGNOSIS — J449 Chronic obstructive pulmonary disease, unspecified: Secondary | ICD-10-CM

## 2013-05-20 DIAGNOSIS — E43 Unspecified severe protein-calorie malnutrition: Secondary | ICD-10-CM | POA: Insufficient documentation

## 2013-05-20 LAB — LIPASE, BLOOD: Lipase: 93 U/L — ABNORMAL HIGH (ref 11–59)

## 2013-05-20 MED ORDER — OXYCODONE HCL 5 MG PO TABS
5.0000 mg | ORAL_TABLET | ORAL | Status: DC | PRN
  Administered 2013-05-20 – 2013-05-21 (×3): 15 mg via ORAL
  Administered 2013-05-21: 5 mg via ORAL
  Administered 2013-05-21 (×3): 15 mg via ORAL
  Administered 2013-05-21: 10 mg via ORAL
  Filled 2013-05-20 (×7): qty 3

## 2013-05-20 MED ORDER — PANTOPRAZOLE SODIUM 40 MG PO TBEC
40.0000 mg | DELAYED_RELEASE_TABLET | Freq: Every day | ORAL | Status: DC
  Administered 2013-05-20 – 2013-05-21 (×2): 40 mg via ORAL
  Filled 2013-05-20 (×2): qty 1

## 2013-05-20 MED ORDER — ENSURE COMPLETE PO LIQD
237.0000 mL | Freq: Three times a day (TID) | ORAL | Status: DC
  Administered 2013-05-20 – 2013-05-21 (×4): 237 mL via ORAL

## 2013-05-20 MED ORDER — HYDROMORPHONE HCL PF 1 MG/ML IJ SOLN
1.0000 mg | INTRAMUSCULAR | Status: DC | PRN
  Administered 2013-05-20 – 2013-05-21 (×3): 1 mg via INTRAVENOUS
  Filled 2013-05-20 (×4): qty 1

## 2013-05-20 NOTE — Progress Notes (Signed)
Patient visited chaplain's office seeking financial support to return home to [place] when discharged. Patient insisted he had permission to leave unit. Chaplain escorted patient back to his room, encouraging patient to remain on unit and listening empathically to patient's concerns. Patient is a [place] veteran. He said "all of his family members are dead," including his brother and mother. He did not mention his daughter, who is listed as his family contact. He does have a faith community in [place], but he says "they are all older than he is and have no funds or ability to assist him." He said, "he called his pastor and he said to see if chaplains or social work could help him." He attends [employer] in [place] with Rev. Gery Pray. Patient appeared and expressed feeling nervous/anxious and confused. Provided emotional support, empathic listening, and prayer. Will refer to social work and unit chaplain for monitoring and follow-up.   Maurene Capes [phone]

## 2013-05-20 NOTE — Progress Notes (Signed)
[facility] PROGRESS NOTE  Tyrone Tyrone [name] RWE:[id number] DOB: 07-16-1955 DOA: 05/18/2013 PCP: No primary provider on file.  Assessment/Plan: 1.Abdominal wall abscess at site of surgical wound: At [facility], patient was treated with IV fluids, IV levofloxacin and IV Dilaudid. -continue zosyn -incision opened up per surgery 3/7&8, appreciate assistance -continue local wound care, discussed with surgery PA and plan is to place wound VAC later today. -continue current pain management dilaudid dose increased for dressing changes 2.Mild dehydration:  -improved with hydration. 3.Uncontrolled hypertension:  -Secondary to noncompliance.  -metoprolol 25 mg by mouth twice a day 4.Left hand numbness:  Unclear etiology. States that he has had this symptom since discharge.CT head negative.  -PE non focal 5.History of polysubstance abuse: Patient denies alcohol use and has no signs of withdrawal at this time  -will d/Tyrone [name] ativan detox protocol, and resame his outpt xanax and follow 6.History of schizoaffective disorder/bipolar disorder/TBI/prior CVA: It appears as though patient has not taken any medications since discharge-was supposedly discharged on Zyprexa 5 mg daily. For now monitor off medications. -continue xanax 7.Elevated lipase: Unclear significance. -started on regular diet per surgery -will follow for tolerance and recheck lipase in am 8.Intermittent epistaxis: None currently. Monitor. 9.Anemia: Follow CBCs 10.COPD: Stable 11.History of CAD/ischemic cardiomyopathy: Asymptomatic. EKG without acute changes and troponin x1 negative. 12.History of hepatitis Tyrone [name]: 13.Reported hematuria: Urine microscopy negative for blood.   Code Status: full Family Communication: none at bedside  Disposition Plan: to home when medically ready   Consultants:  surgery  Procedures:  none  Antibiotics:  Zosyn started 3/7  HPI/Subjective: Tyrone [name] pain with dressing changes today, no numbness,  chest pain or shortness of breath reported.  Objective: Filed Vitals:   05/20/13 1338  BP: 151/89  Pulse: 70  Temp: 97.9 F (36.6 Tyrone [name])  Resp: 18    Intake/Output Summary (Last 24 hours) at 05/20/13 1840 Last data filed at 05/20/13 1339  Gross per 24 hour  Intake   1830 ml  Output      0 ml  Net   1830 ml   Filed Weights   05/19/13 0619 05/20/13 0507  Weight: 64.91 kg (143 lb 1.6 oz) 67.405 kg (148 lb 9.6 oz)    Exam:  General: alert & oriented x 3 In NAD Cardiovascular: RRR, nl S1 s2 Respiratory: CTAB Abdomen: soft +BS NT/ND, abd wound with dressing clean and dry.no masses palpable Extremities: No cyanosis and no edema     Data Reviewed: Basic Metabolic Panel:  Recent Labs Lab 05/19/13 0659  NA 136*  K 3.9  CL 99  CO2 26  GLUCOSE 107*  BUN 6  CREATININE 0.80  CALCIUM 8.7   Liver Function Tests:  Recent Labs Lab 05/19/13 0659  AST 37  ALT 21  ALKPHOS 68  BILITOT 0.6  PROT 5.7*  ALBUMIN 2.9*    Recent Labs Lab 05/19/13 0659 05/19/13 2355  LIPASE 79* 93*   No results found for this basename: AMMONIA,  in the last 168 hours CBC:  Recent Labs Lab 05/19/13 0659  WBC 10.8*  HGB 9.9*  HCT 30.6*  MCV 95.3  PLT 313   Cardiac Enzymes: No results found for this basename: CKTOTAL, CKMB, CKMBINDEX, TROPONINI,  in the last 168 hours BNP (last 3 results) No results found for this basename: PROBNP,  in the last 8760 hours CBG: No results found for this basename: GLUCAP,  in the last 168 hours  Recent Results (from the past 240 hour(s))  WOUND CULTURE  Status: None   Collection Time    05/18/13  6:35 PM      Result Value Ref Range Status   Specimen Description WOUND   Final   Special Requests MIDLINE SURGICAL WOUND   Final   Gram Stain     Final   Value: FEW WBC PRESENT, PREDOMINANTLY PMN     NO SQUAMOUS EPITHELIAL CELLS SEEN     NO ORGANISMS SEEN     Performed at [facility]   Culture     Final   Value: NO GROWTH 1 DAY      Performed at [facility]   Report Status PENDING   Incomplete     Studies: No results found.  Scheduled Meds: . ALPRAZolam  1 mg Oral TID  . feeding supplement (ENSURE COMPLETE)  237 mL Oral TID WC & HS  . folic acid  1 mg Oral Daily  . metoprolol tartrate  25 mg Oral BID  . multivitamin with minerals  1 tablet Oral Daily  . pantoprazole  40 mg Oral Q1200  . piperacillin-tazobactam (ZOSYN)  IV  3.375 g Intravenous 3 times per day  . thiamine  100 mg Oral Daily   Continuous Infusions:   Principal Problem:   Abdominal wall abscess at site of surgical wound Active Problems:   Anemia   Tobacco use disorder   Polysubstance abuse   Alcohol abuse   HTN (hypertension)   Hepatitis Tyrone [name]   COPD (chronic obstructive pulmonary disease)   Schizoaffective disorder   Bipolar disorder   Protein-calorie malnutrition, severe    Time spent: 25    [facility],Tyrone Tyrone [name]  [facility] Pager [phone]. If 7PM-7AM, please contact night-coverage at [email or web address], password [email or web address] 05/20/2013, 6:40 PM  LOS: 2 days

## 2013-05-20 NOTE — Progress Notes (Signed)
Patient ID: Tyrone [name], male   DOB: [birth year], 58 y.o.   MRN: [id number]   LOS: 2 days   Subjective: C/o undertreated abdominal pain, says it's been hurting worse the last few days. Denies N/V, +flatus.   Objective: Vital signs in last 24 hours: Temp:  [97.8 F (36.6 C)-98.6 F (37 C)] 98.1 F (36.7 C) (03/09 0502) Pulse Rate:  [66-73] 71 (03/09 0502) Resp:  [16-20] 20 (03/09 0502) BP: (146-165)/(70-95) 165/95 mmHg (03/09 0502) SpO2:  [99 %-100 %] 100 % (03/09 0502) Weight:  [148 lb 9.6 oz (67.405 kg)] 148 lb 9.6 oz (67.405 kg) (03/09 0507) Last BM Date: 05/18/13   Physical Exam General appearance: alert and no distress Resp: clear to auscultation bilaterally Cardio: regular rate and rhythm GI: Soft, +BS. Incision granulating well with ~10% fibrinic material in wound bed. About half sharply debrided. Tightened fascial sutures that were loose and retied.   Assessment/Plan: EXPLORATORY LAPAROTOMY, OVERSEW OF GASTRIC AV MALFORMATION, REPAIR OF GASTROTOMY - 04/26/2013 - J. Wyatt  2. Superficial abdominal wound abscess -- Wound looks good, no odor, WBC falling, afebrile. Would benefit from [facility] at this point, will consult WOC RN.  Left to primary service to address pain treatment.     Freeman Caldron, PA-C Pager: [phone] General Trauma PA Pager: [phone]  05/20/2013

## 2013-05-20 NOTE — Consult Note (Signed)
Pt to have NPWT dressing placed to the midline wound, after review of the image and discussion with the bedside nurse. Ok for bedside nurse to place. This is not a complicated placement and staff on 6N very familiar with NPWT use and dressings.   Will follow up in am for any further needs related to [facility] dressing Azari [facility] RN,CWOCN [phone]

## 2013-05-20 NOTE — Progress Notes (Signed)
Pt was restless and agitated most of the day. Asking for more pain medicine. Notified by security that he was soliciting money from visitors in the lobby to cover the cost of transportation back to [place]. Threatened to go to the ED to get "pain relief". Walked into an inpatient room and asked for money on two occasions. He frequently leaves the unit to smoke. I'm concerned about his ability to comply with the [facility] therapy. Malen Gauze, RN

## 2013-05-20 NOTE — Progress Notes (Signed)
Chaplain responded to referral from [place] concerning pt who needed help getting to his home when discharged and also emotional support. Chaplain received message from social work that they did not have resources for him [place]. Chaplain relayed this message to pt who seemed frustrated by this. Pt vented his frustrations while chaplain practiced active listening. Chaplain also offered prayer.

## 2013-05-20 NOTE — Progress Notes (Signed)
I have seen and examined the patient and agree with the assessment and plans.  Rickey Farrier A. Palmer Shorey  MD, FACS  

## 2013-05-20 NOTE — Progress Notes (Addendum)
INITIAL NUTRITION ASSESSMENT  DOCUMENTATION CODES Per approved criteria  -Severe malnutrition in the context of chronic illness  Pt meets criteria for SEVERE MALNUTRITION in the context of Chronic Illness as evidenced by 33% weight loss in less than 6 months per report and estimated energy intake <75% of estimated energy intake for > 1 month.  INTERVENTION: Provide Ensure Complete QID Encourage PO intake  NUTRITION DIAGNOSIS: Inadequate oral intake related to poor appetite as evidenced by 33% weight loss in less than 6 months per pt report  Goal: Pt to meet >/= 90% of their estimated nutrition needs   Monitor:  PO intake, weight, labs  Reason for Assessment: Malnutrition Screening Tool, score of 3  58 y.o. male  Admitting Dx: Abdominal wall abscess at site of surgical wound  ASSESSMENT: 58 y.o. male with history of alcohol and polysubstance abuse, CVA, hypertension, hepatitis C, COPD, chronic leukocytosis, ischemic cardiomyopathy, schizoaffective disorder, bipolar disorder, depression, anxiety, TBI status post craniotomy, GI bleed, hospitalized at [facility] from 2/12-2/19 for upper GI bleed, hemorrhagic shock, acute post hemorrhagic anemia, acute respiratory failure. He underwent exploratory laparotomy with gastrotomy and oversew of gastric AVM on 2/13. He was transferred from [facility] secondary to abdominal pain, anterior abdominal wall abscess seen on CT and? Hematemesis? Pancreatitis.  Pt states that he weighed 220 lbs 6 months ago and has lost weight due to having no appetite. Pt states he hasn't eaten for 12 days. He reports eating 1 or 2 meals per month. He says he has been drinking fluid daily including some Ensure supplements. Based on pt's report he has lost 33% of his body weight in the past 6 months but, no signs of severe wasting evidenced in physical exam.  Weight history shows 26% weight loss in the past 14 months. Encouraged pt to eat even if he  doesn't feel like eating. Encouraged use of nutritional supplements to prevent further weight loss and to promote wound healing.   Nutrition Focused Physical Exam: (Difficult to assess as pt is fully clothed; exam may not be 100% accurate)  Subcutaneous Fat:  Orbital Region: wnl  Upper Arm Region: mild wasting Thoracic and Lumbar Region: NA  Muscle:  Temple Region: mild wasting Clavicle Bone Region: mild wasting Clavicle and Acromion Bone Region: mild wasting Scapular Bone Region: NA Dorsal Hand: wnl Patellar Region: mild wasting Anterior Thigh Region: mild/moderate wasting Posterior Calf Region: NA  Edema: none   Height: Ht Readings from Last 1 Encounters:  05/19/13 5' 10.08" (1.78 m)    Weight: Wt Readings from Last 1 Encounters:  05/20/13 148 lb 9.6 oz (67.405 kg)    Ideal Body Weight: 166 lbs  % Ideal Body Weight: 89%  Wt Readings from Last 10 Encounters:  05/20/13 148 lb 9.6 oz (67.405 kg)  04/27/13 171 lb 1.2 oz (77.6 kg)  04/27/13 171 lb 1.2 oz (77.6 kg)  04/27/13 171 lb 1.2 oz (77.6 kg)  04/25/13 150 lb (68.04 kg)  03/26/12 200 lb (90.719 kg)    Usual Body Weight: 220 lbs  % Usual Body Weight: 67%  BMI:  Body mass index is 21.27 kg/(m^2).  Estimated Nutritional Needs: Kcal: 1800-2000 Protein: 100-115 grams Fluid: 2-2.2 L/day  Skin: abdominal incision  Diet Order: General  EDUCATION NEEDS: -No education needs identified at this time   Intake/Output Summary (Last 24 hours) at 05/20/13 1220 Last data filed at 05/20/13 0900  Gross per 24 hour  Intake   2310 ml  Output  0 ml  Net   2310 ml    Last BM: 3/7   Labs:   Recent Labs Lab 05/19/13 0659  NA 136*  K 3.9  CL 99  CO2 26  BUN 6  CREATININE 0.80  CALCIUM 8.7  GLUCOSE 107*    CBG (last 3)  No results found for this basename: GLUCAP,  in the last 72 hours  Scheduled Meds: . ALPRAZolam  1 mg Oral TID  . folic acid  1 mg Oral Daily  . metoprolol tartrate  25 mg  Oral BID  . multivitamin with minerals  1 tablet Oral Daily  . pantoprazole  40 mg Oral Q1200  . piperacillin-tazobactam (ZOSYN)  IV  3.375 g Intravenous 3 times per day  . thiamine  100 mg Oral Daily    Continuous Infusions:   Past Medical History  Diagnosis Date  . Myocardial infarction     2008  . Polysubstance abuse   . Alcohol abuse   . CVA (cerebral infarction)   . HTN (hypertension)   . Hepatitis C   . COPD (chronic obstructive pulmonary disease)   . Ischemic cardiomyopathy   . Schizoaffective disorder   . Bipolar disorder   . TBI (traumatic brain injury)   . GI bleed     Past Surgical History  Procedure Laterality Date  . Splenectomy    . Coronary angioplasty with stent placement    . Esophagogastroduodenoscopy N/A 04/26/2013    Procedure: ESOPHAGOGASTRODUODENOSCOPY (EGD);  Surgeon: Graylin ShiverSalem F Ganem, MD;  [facility];  Service: Endoscopy;  Laterality: N/A;  . Laparotomy N/A 04/26/2013    Procedure: EXPLORATORY LAPAROTOMY, OVERSEW OF GASTRIC AV MALFORMATION, REPAIR OF GASTROTOMY;  Surgeon: Cherylynn RidgesJames O Wyatt, MD;  Location: [facility] OR;  Service: General;  Laterality: N/A;  . Craniotomy      Ian Malkineanne Barnett RD, LDN Inpatient Clinical Dietitian Pager: [phone] After Hours Pager: [phone]

## 2013-05-20 NOTE — Progress Notes (Signed)
ANTIBIOTIC CONSULT NOTE - FOLLOW UP  Pharmacy Consult for Zosyn Indication: Abdominal wall abscess  Allergies  Allergen Reactions  . Nitroglycerin Nausea And Vomiting  . Toradol [Ketorolac Tromethamine] Itching  . Ultram [Tramadol] Itching    Patient Measurements: Height: 5' 10.08" (178 cm) Weight: 148 lb 9.6 oz (67.405 kg) IBW/kg (Calculated) : 73.18 Adjusted Body Weight:   Vital Signs: Temp: 98.1 F (36.7 C) (03/09 0502) Temp src: Oral (03/09 0502) BP: 165/95 mmHg (03/09 0502) Pulse Rate: 71 (03/09 0502) Intake/Output from previous day: 03/08 0701 - 03/09 0700 In: 3030 [P.O.:2880; IV Piggyback:150] Out: -  Intake/Output from this shift: Total I/O In: 240 [P.O.:240] Out: -   Labs:  Recent Labs  05/19/13 0659  WBC 10.8*  HGB 9.9*  PLT 313  CREATININE 0.80   Estimated Creatinine Clearance: 96 ml/min (by C-G formula based on Cr of 0.8). No results found for this basename: VANCOTROUGH, Tyrone [name], VANCORANDOM, GENTTROUGH, GENTPEAK, GENTRANDOM, TOBRATROUGH, TOBRAPEAK, TOBRARND, AMIKACINPEAK, AMIKACINTROU, AMIKACIN,  in the last 72 hours   Microbiology: Recent Results (from the past 720 hour(s))  MRSA PCR SCREENING     Status: None   Collection Time    04/25/13  8:40 AM      Result Value Ref Range Status   MRSA by PCR NEGATIVE  NEGATIVE Final   Comment:            The GeneXpert MRSA Assay (FDA     approved for NASAL specimens     only), is one component of a     comprehensive MRSA colonization     surveillance program. It is not     intended to diagnose MRSA     infection nor to guide or     monitor treatment for     MRSA infections.  MRSA PCR SCREENING     Status: None   Collection Time    04/25/13 11:35 PM      Result Value Ref Range Status   MRSA by PCR NEGATIVE  NEGATIVE Final   Comment:            The GeneXpert MRSA Assay (FDA     approved for NASAL specimens     only), is one component of a     comprehensive MRSA colonization     surveillance  program. It is not     intended to diagnose MRSA     infection nor to guide or     monitor treatment for     MRSA infections.  URINE CULTURE     Status: None   Collection Time    04/30/13  2:59 PM      Result Value Ref Range Status   Specimen Description URINE, CLEAN CATCH   Final   Special Requests NONE   Final   Culture  Setup Time     Final   Value: 04/30/2013 20:05     Performed at [facility] Count     Final   Value: 5,000 COLONIES/ML     Performed at [facility]   Culture     Final   Value: INSIGNIFICANT GROWTH     Performed at [facility]   Report Status 05/01/2013 FINAL   Final  WOUND CULTURE     Status: None   Collection Time    05/18/13  6:35 PM      Result Value Ref Range Status   Specimen Description WOUND   Final   Special Requests  MIDLINE SURGICAL WOUND   Final   Gram Stain     Final   Value: FEW WBC PRESENT, PREDOMINANTLY PMN     NO SQUAMOUS EPITHELIAL CELLS SEEN     NO ORGANISMS SEEN     [facility]formed at [facility]   Culture     Final   Value: NO GROWTH 1 DAY     Performed at [facility]   Report Status PENDING   Incomplete    Anti-infectives   Start     Dose/Rate Route Frequency Ordered Stop   05/18/13 1930  piperacillin-tazobactam (ZOSYN) IVPB 3.375 g     3.375 g 12.5 mL/hr over 240 Minutes Intravenous 3 times per day 05/18/13 1836     05/18/13 1830  vancomycin (VANCOCIN) IVPB 1000 mg/200 mL premix  Status:  Discontinued     1,000 mg 200 mL/hr over 60 Minutes Intravenous Every 12 hours 05/18/13 1748 05/18/13 1834      Assessment: Admit: abd pain and intermittent nosebleeds  Infectious Disease: Zosyn D#3 for abd wall abscess, afebrile, WBC improving 10.8  Vanc 3/7 >> 3/7 Zosyn 3/7 >>  3/7 wound cx -NGTD  Renal: Scr 0.8 with CrCl 96   Plan:   Continue Zosyn 3.375g IV q8hr Pharmacy will sign off. No further adjustment needed. Please re-consult for further dosing assitance.   Tyrone [name]  Tyrone [name], Tyrone [name], BCPS Clinical Staff Pharmacist Pager [phone]  Tyrone [name], Tyrone [name] 05/20/2013,11:44 AM

## 2013-05-21 DIAGNOSIS — D62 Acute posthemorrhagic anemia: Secondary | ICD-10-CM

## 2013-05-21 LAB — WOUND CULTURE: CULTURE: NO GROWTH

## 2013-05-21 MED ORDER — METOPROLOL TARTRATE 25 MG PO TABS: 25.0000 mg | ORAL_TABLET | Freq: Two times a day (BID) | ORAL | Status: DC

## 2013-05-21 MED ORDER — OLANZAPINE 5 MG PO TABS
5.0000 mg | ORAL_TABLET | Freq: Every day | ORAL | Status: DC
  Administered 2013-05-21: 5 mg via ORAL
  Filled 2013-05-21: qty 1

## 2013-05-21 MED ORDER — NICOTINE 21 MG/24HR TD PT24
21.0000 mg | MEDICATED_PATCH | Freq: Every day | TRANSDERMAL | Status: DC
  Administered 2013-05-21: 21 mg via TRANSDERMAL
  Filled 2013-05-21: qty 1

## 2013-05-21 MED ORDER — OLANZAPINE 5 MG PO TABS: 5.0000 mg | ORAL_TABLET | Freq: Every day | ORAL | Status: DC

## 2013-05-21 MED ORDER — AMOXICILLIN-POT CLAVULANATE 875-125 MG PO TABS: 1.0000 | ORAL_TABLET | Freq: Two times a day (BID) | ORAL | Status: DC

## 2013-05-21 MED ORDER — ALPRAZOLAM 1 MG PO TABS: 1.0000 mg | ORAL_TABLET | Freq: Three times a day (TID) | ORAL | Status: DC | PRN

## 2013-05-21 MED ORDER — ENSURE COMPLETE PO LIQD: 237.0000 mL | Freq: Three times a day (TID) | ORAL | Status: DC

## 2013-05-21 MED ORDER — OXYCODONE HCL 5 MG PO TABS: 5.0000 mg | ORAL_TABLET | ORAL | Status: DC | PRN

## 2013-05-21 NOTE — Progress Notes (Signed)
Discharge instructions reviewed with patient, questions answered, verbalized understanding.  Patient and wound vac as well as wound vac supplies walked out to front of hospital to be taken back to [place] by [facility] as arranged by social work.  Patient left with prescriptions for Xanax, Zyprexa, and Oxy IR as well as prescriptions for antibiotics.  Patient in good condition at time of discharge from [facility].

## 2013-05-21 NOTE — Progress Notes (Signed)
Patient does not remember having VAC dressing placed; says he wants it out and threatening to pull it out.  Says he does not want to walk around with IV pole and would not allow me to hook him up to his IV to give his IV antibiotics last night. He states, "ya'll got to figure out a better way to give me antibiotics without hooking me up to this thing." Explained to pt that VAC and IV antibiotics would help him heal and get better. Patient had been going outside to smoke while connected to IV pump.   Still  demanding to get the [facility] dressing out but then he laid down and went to sleep.  Will continue to monitor and attempt to give him IV antibiotics.

## 2013-05-21 NOTE — Progress Notes (Signed)
Walked patient over to outpatient pharmacy so he could pick up his prescriptions prior to discharge.  Pharmacy refused to fill patients Oxy IR as insurance stated it would not cover medication as patient had been prescribed this same medication by numerous providers.

## 2013-05-21 NOTE — Care Management Note (Signed)
    Page 1 of 1   05/21/2013     11:32:21 AM   CARE MANAGEMENT NOTE 05/21/2013  Patient:  [facility]   Account Number:  [id number]  Date Initiated:  05/21/2013  Documentation initiated by:  Ronny Flurry  Subjective/Objective Assessment:     Action/Plan:   Anticipated DC Date:  05/21/2013   Anticipated DC Plan:  HOME W HOME HEALTH SERVICES         Choice offered to / List presented to:  C-1 Patient           HH agency  [facility]   Status of service:   Medicare Important Message given?   (If response is "NO", the following Medicare IM given date fields will be blank) Date Medicare IM given:   Date Additional Medicare IM given:    Discharge Disposition:    Per UR Regulation:    If discussed at Long Length of Stay Meetings, dates discussed:    Comments:  05-21-13 Patient discharging to [address] , [place] [address] [facility] [address] ) phone [phone] . Spoke with Darel Hong at [facility] phone [phone] , instructed to fax information to her and she will have intake person call me back . Same done fax number [phone] . Awaiting call back.  Faxed information to Ricki at [facility] awaiting call back.  Ronny Flurry RN BSN [phone]

## 2013-05-21 NOTE — Progress Notes (Signed)
Subjective: Pt doing well, pain is well controlled as evident by his miles of walking on and off the floor without discomfort.  Patient s/p wound vac placement.  Patient is being very difficult with the nursing staff.  He has also been going off the floor to smoke cigarette without permission.  He's getting his hair washed by the techs.  Objective: Vital signs in last 24 hours: Temp:  [97.4 F (36.3 C)-97.9 F (36.6 C)] 97.8 F (36.6 C) (03/10 0646) Pulse Rate:  [70-75] 75 (03/10 0646) Resp:  [16-18] 16 (03/10 0646) BP: (126-154)/(83-93) 154/83 mmHg (03/10 0646) SpO2:  [98 %-100 %] 100 % (03/10 0646) Weight:  [144 lb 2.9 oz (65.4 kg)] 144 lb 2.9 oz (65.4 kg) (03/10 0646) Last BM Date: 05/18/13  Intake/Output from previous day: 03/09 0701 - 03/10 0700 In: 840 [P.O.:840] Out: 0  Intake/Output this shift:    PE: Gen:  Alert, NAD, pleasant Abd: Soft, minimally tender with distracted exam, ND, great BS, no HSM, midline wound C/D/I, drain with serosanguinous drainage   Lab Results:   Recent Labs  05/19/13 0659  WBC 10.8*  HGB 9.9*  HCT 30.6*  PLT 313   BMET  Recent Labs  05/19/13 0659  NA 136*  K 3.9  CL 99  CO2 26  GLUCOSE 107*  BUN 6  CREATININE 0.80  CALCIUM 8.7   PT/INR No results found for this basename: LABPROT, INR,  in the last 72 hours CMP     Component Value Date/Time   NA 136* 05/19/2013 0659   K 3.9 05/19/2013 0659   CL 99 05/19/2013 0659   CO2 26 05/19/2013 0659   GLUCOSE 107* 05/19/2013 0659   BUN 6 05/19/2013 0659   CREATININE 0.80 05/19/2013 0659   CALCIUM 8.7 05/19/2013 0659   PROT 5.7* 05/19/2013 0659   ALBUMIN 2.9* 05/19/2013 0659   AST 37 05/19/2013 0659   ALT 21 05/19/2013 0659   ALKPHOS 68 05/19/2013 0659   BILITOT 0.6 05/19/2013 0659   GFRNONAA >90 05/19/2013 0659   GFRAA >90 05/19/2013 0659   Lipase     Component Value Date/Time   LIPASE 93* 05/19/2013 2355       Studies/Results: No results found.  Anti-infectives: Anti-infectives   Start      Dose/Rate Route Frequency Ordered Stop   05/18/13 1930  piperacillin-tazobactam (ZOSYN) IVPB 3.375 g     3.375 g 12.5 mL/hr over 240 Minutes Intravenous 3 times per day 05/18/13 1836     05/18/13 1830  vancomycin (VANCOCIN) IVPB 1000 mg/200 mL premix  Status:  Discontinued     1,000 mg 200 mL/hr over 60 Minutes Intravenous Every 12 hours 05/18/13 1748 05/18/13 1834       Assessment/Plan 1. ~1 mo PO s/p EXPLORATORY LAPAROTOMY, OVERSEW OF GASTRIC AV MALFORMATION, REPAIR OF GASTROTOMY - 04/26/2013 - J. Wyatt  2. Superficial abdominal wound abscess   -On Zosyn Day - Can likely go home on Augmentin for 7 days  -Continue wound care with Wound vac M/W/F, vac form signed  -Tolerating regular diet, ambulating well, pain well controlled - d/c dilaudid for anticipation for discharge home  today  -Follow up with Dr. Lindie SpruceWyatt (he has an appointment on 05/28/13 at 10:00am)  -Awaiting insurance approval for wound vac upon discharge 3. History of alcohol and polysubstance abuse  4. Hep C  5. COPD  6. Schizoaffective disorder/bipolar  7. Hgb - 9.9 - 05/19/2013     LOS: 3 days    DORT,  Briseyda Fehr 05/21/2013, 10:19 AM Pager: [phone]

## 2013-05-21 NOTE — Discharge Summary (Signed)
Physician Discharge Summary  Tyrone ElmJohn [name] GNF:[id number] DOB: 25-Sep-1955 DOA: 05/18/2013  PCP: No primary provider on file.  Admit date: 05/18/2013 Discharge date: 05/21/2013  Time spent: >30 minutes  Recommendations for Outpatient Follow-up:  Follow-up Information   Follow up with Cherylynn RidgesWYATT, JAMES O, MD On 05/28/2013. (For post-operation and wound check on 05/28/13 at 10:00am for recheck.  Please arrive by 9:30am for check in and to fill out paperwork.)    Specialty:  General Surgery   Contact information:   [address], [address]  [facility] [place] [address] [phone]       Follow up with No PCP Per Patient. (FIND A PRIMARY CARE PHYSICIAN TO HELP FOLLOW YOUR CHRONIC MEDICAL CONDITIONS)    Specialty:  General Practice      Schedule an appointment as soon as possible for a visit with [facility]    .   Contact information:   [address] [place] [address] [phone]      Follow up with Cherylynn RidgesWYATT, JAMES O, MD. (on 05/28/13 at 10:00am)    Specialty:  General Surgery   Contact information:   [address]  [facility] [place] [address] [phone]        Discharge Diagnoses:  Principal Problem:   Abdominal wall abscess at site of surgical wound Active Problems:   Anemia   Tobacco use disorder   Polysubstance abuse   Alcohol abuse   HTN (hypertension)   Hepatitis C   COPD (chronic obstructive pulmonary disease)   Schizoaffective disorder   Bipolar disorder   Protein-calorie malnutrition, severe   Discharge Condition: improved/stable  Diet recommendation: heart healthy  Filed Weights   05/19/13 0619 05/20/13 0507 05/21/13 0646  Weight: 64.91 kg (143 lb 1.6 oz) 67.405 kg (148 lb 9.6 oz) 65.4 kg (144 lb 2.9 oz)    History of present illness:  Tyrone ElmJohn Langland is a 58 y.o. male with history of alcohol and polysubstance abuse, CVA, hypertension, hepatitis C, COPD, chronic  leukocytosis, ischemic cardiomyopathy, schizoaffective disorder, bipolar disorder, depression, anxiety, TBI status post craniotomy, GI bleed, [facility]-2/19 for upper GI bleed, hemorrhagic shock, acute post hemorrhagic anemia, acute respiratory failure. He underwent exploratory laparotomy with gastrotomy and oversew of gastric AVM on 2/13. He was [facility] secondary to abdominal pain, anterior abdominal wall abscess seen on CT and? Hematemesis? Pancreatitis. Patient is a very poor historian and poor reliability, secondary to underlying psychiatric condition and TBI. He states that he does not even remember much of his hospitalization at [facility]. He claims he was discharged without followup or medications. Since discharge he has not taken any medications or seen any M.D. until today. He states that he's had ongoing anterior abdominal wall pain at the site of surgery which progressively gotten worse in the last couple of days. Pain is nonradiating and worse with coughing and movement. He states that he has had nausea and intermittent vomiting without blood or coffee grounds. He also repeatedly states that he has not had anything to eat or drink for 10 days. He has a watery BM daily and the last one was yesterday. He gives history of intermittent epistaxis and some hematuria. Positive for chills but no fevers. He denies chest pain, dyspnea or cough. No dysuria. He denies suicidal or homicidal ideations, delusions or hallucinations. He also complains of left hand numbness and dropping objects  frequently since surgery in February. He was transferred for further management.      Hospital Course:  1.Abdominal wall abscess at site of surgical wound: At [facility], patient was treated with IV fluids, IV levofloxacin and IV Dilaudid.  -upon transfer to [facility] pt was placed on empiric abx with zosyn and surgerywas consulted -incision  opened up per surgery 3/7&8, appreciate assistance  -continue local wound care, s/p wound VAC placement 3/9.  -pt's pain was managed with dilaudid prn and oxycodone -He was seen by surgery today and from surgery standpoint ok to d/c on augmentin x1week and he is to follow up with Dr Lindie Spruce on 3/17.  2.Mild dehydration:  -improved with hydration.  3.Uncontrolled hypertension:  -Secondary to noncompliance.  -metoprolol 25 mg by mouth twice a day  -he is to follow up with PCP at [facility] 4.Left hand numbness:  Unclear etiology. States that he has had this symptom since discharge.CT head negative.  -resolved, PE non focal.  5.History of polysubstance abuse: Patient denies alcohol use and has no signs of withdrawal at this time  -will d/c ativan detox protocol, and resame his outpt xanax and follow  6.History of schizoaffective disorder/bipolar disorder/TBI/prior CVA: It appears as though patient has not taken any medications since discharge-was supposedly discharged on Zyprexa 5 mg daily.  -continue xanax  - Zyprexa restarted today 3/10 and he is to continue upon discharge 7.Elevated lipase: Unclear significance.  - was 93 on 3/9, after had been started on regular diet 3/8 per surgery and has been tolerating by mouth's well with no nausea or vomiting.  8.Intermittent epistaxis: None currently. Monitor.  9.Anemia: Follow CBCs  10.COPD: Stable  11.History of CAD/ischemic cardiomyopathy: Asymptomatic. EKG without acute changes and troponin x1 negative.  12.History of hepatitis C:  13.Reported hematuria: Urine microscopy negative for blood.  14 Tobacco abuse  -he was placed on nicotine patch in the hospital.     Procedures: incision opened up per surgery 3/7&8 and wound VAC placed on 3/9   Consultations:  surgery  Discharge Exam: Filed Vitals:   05/21/13 1356  BP: 140/83  Pulse: 76  Temp: 98.2 F (36.8 C)  Resp: 18   Exam:  General: alert & oriented x 3 In NAD   Cardiovascular: RRR, nl S1 s2  Respiratory: CTAB  Abdomen: soft +BS ND, abd wound with wound vac, no masses palpable  Extremities: No cyanosis and no edema    Discharge Instructions  Discharge Orders   Future Appointments Provider Department Dept Phone   05/28/2013 10:00 AM Cherylynn Ridges, MD [facility] [place] [phone]   Future Orders Complete By Expires   Diet - low sodium heart healthy  As directed    Increase activity slowly  As directed        Medication List    STOP taking these medications       HYDROcodone-acetaminophen 5-325 MG per tablet  Commonly known as:  NORCO/VICODIN      TAKE these medications       ALPRAZolam 1 MG tablet  Commonly known as:  XANAX  Take 1 tablet (1 mg total) by mouth 3 (three) times daily as needed for anxiety.     amoxicillin-clavulanate 875-125 MG per tablet  Commonly known as:  AUGMENTIN  Take 1 tablet by mouth 2 (two) times daily.     feeding supplement (ENSURE COMPLETE) Liqd  Take 237 mLs by mouth 4 (four) times daily -  with meals and at bedtime.  metoprolol tartrate 25 MG tablet  Commonly known as:  LOPRESSOR  Take 1 tablet (25 mg total) by mouth 2 (two) times daily.     OLANZapine 5 MG tablet  Commonly known as:  ZYPREXA  Take 1 tablet (5 mg total) by mouth daily.     oxyCODONE 5 MG immediate release tablet  Commonly known as:  Oxy IR/ROXICODONE  Take 1-2 tablets (5-10 mg total) by mouth every 4 (four) hours as needed for severe pain (5mg  for mild pain, 10mg  for moderate pain, 15mg  for severe pain).       Allergies  Allergen Reactions  . Nitroglycerin Nausea And Vomiting  . Toradol [Ketorolac Tromethamine] Itching  . Ultram [Tramadol] Itching       Follow-up Information   Follow up with Cherylynn Ridges, MD On 05/28/2013. (For post-operation and wound check on 05/28/13 at 10:00am for recheck.  Please arrive by 9:30am for check in and to fill out paperwork.)    Specialty:  General Surgery   Contact  information:   [address], [address]  [facility] [place] [place] [address] [phone]       Follow up with No PCP Per Patient. (FIND A PRIMARY CARE PHYSICIAN TO HELP FOLLOW YOUR CHRONIC MEDICAL CONDITIONS)    Specialty:  General Practice      Schedule an appointment as soon as possible for a visit with [facility]    .   Contact information:   [address] [place] [address] [phone]       The results of significant diagnostics from this hospitalization (including imaging, microbiology, ancillary and laboratory) are listed below for reference.    Significant Diagnostic Studies: Dg Chest Port 1 View  04/30/2013   CLINICAL DATA:  Cough.  EXAM: PORTABLE CHEST - 1 VIEW  COMPARISON:  04/27/2013.  FINDINGS: The cardiac silhouette remains borderline enlarged. Increased prominence of the pulmonary vasculature and interstitial markings. Oral contrast in the stomach and splenic flexure. Unremarkable bones.  IMPRESSION: Interval mild changes of congestive heart failure.   Electronically Signed   By: Gordan Payment M.D.   On: 04/30/2013 11:09   Dg Chest Port 1 View  04/27/2013   CLINICAL DATA:  ET tube.  EXAM: PORTABLE CHEST - 1 VIEW  COMPARISON:  Chest radiograph 04/26/2013.  FINDINGS: NG tube courses inferior to the diaphragm. ET tube terminates 5.8 cm superior to the carina. Right IJ central venous catheter tip projects over the superior cavoatrial junction.  Low lung volumes. Stable cardiac and mediastinal contours. Mild pulmonary vascular redistribution. No definite large consolidative pulmonary opacity. No pleural effusion or pneumothorax.  IMPRESSION: Stable support apparatus.  Mild pulmonary vascular redistribution.   Electronically Signed   By: Annia Belt M.D.   On: 04/27/2013 08:19   Dg Chest Port 1 View  04/26/2013   CLINICAL DATA:  Central venous line placement  EXAM: PORTABLE CHEST - 1 VIEW  COMPARISON:  None.  FINDINGS: The  heart size and mediastinal contours are within normal limits. Endotracheal tube is identified distal tip 4.9 cm from carina. A right jugular central venous line is identified distal tip in superior vena cava. A nasogastric tube is identified with distal tip not included on film but is at least in stomach. There is no focal infiltrate, pulmonary edema, or pleural effusion. There is no pneumothorax. The visualized skeletal structures are unremarkable.  IMPRESSION: Right jugular central venous line distal tip in superior vena cava. There  is no pneumothorax.   Electronically Signed   By: Sherian Rein M.D.   On: 04/26/2013 01:54   Dg Ugi W/water Sol Cm  04/30/2013   CLINICAL DATA:  Exploratory laparotomy with oversew of gastric AV malformation and repair of gastrostomy 04/26/2013. Question leak.  EXAM: WATER SOLUBLE UPPER GI SERIES  TECHNIQUE: Single-column upper GI series was performed using water soluble contrast.  CONTRAST:  150 ml Omnipaque 300 per mouth.  COMPARISON:  None.  FLUOROSCOPY TIME:  59 seconds.  FINDINGS: The patient swallowed contrast without difficulty. No laryngeal penetration was observed. The esophageal motility is within normal limits.  The stomach was incompletely distended, but demonstrates normal emptying and no gross filling defects. There is some mucosal thickening. There is no extravasation.  IMPRESSION: No evidence of leak following gastric surgery. The stomach empties satisfactorily.   Electronically Signed   By: Roxy Horseman M.D.   On: 04/30/2013 09:33    Microbiology: Recent Results (from the past 240 hour(s))  WOUND CULTURE     Status: None   Collection Time    05/18/13  6:35 PM      Result Value Ref Range Status   Specimen Description WOUND   Final   Special Requests MIDLINE SURGICAL WOUND   Final   Gram Stain     Final   Value: FEW WBC PRESENT, PREDOMINANTLY PMN     NO SQUAMOUS EPITHELIAL CELLS SEEN     NO ORGANISMS SEEN     Performed at [facility]    Culture     Final   Value: NO GROWTH 2 DAYS     Performed at [facility]   Report Status 05/21/2013 FINAL   Final     Labs: Basic Metabolic Panel:  Recent Labs Lab 05/19/13 0659  NA 136*  K 3.9  CL 99  CO2 26  GLUCOSE 107*  BUN 6  CREATININE 0.80  CALCIUM 8.7   Liver Function Tests:  Recent Labs Lab 05/19/13 0659  AST 37  ALT 21  ALKPHOS 68  BILITOT 0.6  PROT 5.7*  ALBUMIN 2.9*    Recent Labs Lab 05/19/13 0659 05/19/13 2355  LIPASE 79* 93*   No results found for this basename: AMMONIA,  in the last 168 hours CBC:  Recent Labs Lab 05/19/13 0659  WBC 10.8*  HGB 9.9*  HCT 30.6*  MCV 95.3  PLT 313   Cardiac Enzymes: No results found for this basename: CKTOTAL, CKMB, CKMBINDEX, TROPONINI,  in the last 168 hours BNP: BNP (last 3 results) No results found for this basename: PROBNP,  in the last 8760 hours CBG: No results found for this basename: GLUCAP,  in the last 168 hours     Signed:  Erin Obando C  [facility] 05/21/2013, 5:09 PM

## 2013-05-21 NOTE — Progress Notes (Signed)
Connected patient to IV for scheduled dose of antibiotics and assisted him to bathroom.  Pt requesting pain med;medicated him with 1mg  Dilaudid IV.  Pt then became very angry,cursing about "all these cords";says" I  don't need anymore antibiotics" and threatening again to pull out IV and VAC dressing.  Told patient we would help him with the cords when he wants to get up. Patient has previously been going outside with IV pole.  Will continue to monitor.

## 2013-05-21 NOTE — Progress Notes (Signed)
[facility] PROGRESS NOTE  Tyrone [name] IWL:[id number] DOB: 08-21-1955 DOA: 05/18/2013 PCP: No primary provider on file.  Assessment/Plan: 1.Abdominal wall abscess at site of surgical wound: At [facility], patient was treated with IV fluids, IV levofloxacin and IV Dilaudid. -continue zosyn -incision opened up per surgery 3/7&8, appreciate assistance -continue local wound care, s/p wound VAC placement 3/9. -continue pain management>> on dilaudid iv and oxycodone inncreased 3/9 -Surgery to follow and advise on when okay to discharge from their standpoint 2.Mild dehydration:  -improved with hydration. 3.Uncontrolled hypertension:  -Secondary to noncompliance.  -metoprolol 25 mg by mouth twice a day 4.Left hand numbness:  Unclear etiology. States that he has had this symptom since discharge.CT head negative.  -PE non focal. 5.History of polysubstance abuse: Patient denies alcohol use and has no signs of withdrawal at this time  -will d/c ativan detox protocol, and resame his outpt xanax and follow 6.History of schizoaffective disorder/bipolar disorder/TBI/prior CVA: It appears as though patient has not taken any medications since discharge-was supposedly discharged on Zyprexa 5 mg daily. -continue xanax -Will will restart the Zyprexa today 3/10 and monitor for improvement 7.Elevated lipase: Unclear significance. - was 93 on 3/9, after had been started on regular diet 3/8 per surgery and has been tolerating by mouth's well with no nausea or vomiting. 8.Intermittent epistaxis: None currently. Monitor. 9.Anemia: Follow CBCs 10.COPD: Stable 11.History of CAD/ischemic cardiomyopathy: Asymptomatic. EKG without acute changes and troponin x1 negative. 12.History of hepatitis C: 13.Reported hematuria: Urine microscopy negative for blood. 14 Tobacco abuse  -Place on nicotine patch    Code Status: full Family Communication: none at bedside  Disposition Plan: to home when medically  ready   Consultants:  surgery  Procedures:  none  Antibiotics:  Zosyn started 3/7  HPI/Subjective:  patient constantly out of room on walking off the floor intermittently per nursing staff- He states that he is hyperactive. Complaining of pain incisional wound area   Objective: Filed Vitals:   05/21/13 0646  BP: 154/83  Pulse: 75  Temp: 97.8 F (36.6 C)  Resp: 16    Intake/Output Summary (Last 24 hours) at 05/21/13 0942 Last data filed at 05/21/13 0700  Gross per 24 hour  Intake    600 ml  Output      0 ml  Net    600 ml   Filed Weights   05/19/13 0619 05/20/13 0507 05/21/13 0646  Weight: 64.91 kg (143 lb 1.6 oz) 67.405 kg (148 lb 9.6 oz) 65.4 kg (144 lb 2.9 oz)    Exam:  General: alert & oriented x 3 In NAD Cardiovascular: RRR, nl S1 s2 Respiratory: CTAB Abdomen: soft +BS ND, abd wound with wound vac, no masses palpable Extremities: No cyanosis and no edema     Data Reviewed: Basic Metabolic Panel:  Recent Labs Lab 05/19/13 0659  NA 136*  K 3.9  CL 99  CO2 26  GLUCOSE 107*  BUN 6  CREATININE 0.80  CALCIUM 8.7   Liver Function Tests:  Recent Labs Lab 05/19/13 0659  AST 37  ALT 21  ALKPHOS 68  BILITOT 0.6  PROT 5.7*  ALBUMIN 2.9*    Recent Labs Lab 05/19/13 0659 05/19/13 2355  LIPASE 79* 93*   No results found for this basename: AMMONIA,  in the last 168 hours CBC:  Recent Labs Lab 05/19/13 0659  WBC 10.8*  HGB 9.9*  HCT 30.6*  MCV 95.3  PLT 313   Cardiac Enzymes: No results found for this basename: CKTOTAL,  CKMB, CKMBINDEX, TROPONINI,  in the last 168 hours BNP (last 3 results) No results found for this basename: PROBNP,  in the last 8760 hours CBG: No results found for this basename: GLUCAP,  in the last 168 hours  Recent Results (from the past 240 hour(s))  WOUND CULTURE     Status: None   Collection Time    05/18/13  6:35 PM      Result Value Ref Range Status   Specimen Description WOUND   Final   Special  Requests MIDLINE SURGICAL WOUND   Final   Gram Stain     Final   Value: FEW WBC PRESENT, PREDOMINANTLY PMN     NO SQUAMOUS EPITHELIAL CELLS SEEN     NO ORGANISMS SEEN     Performed at [facility]   Culture     Final   Value: NO GROWTH 2 DAYS     Performed at [facility]   Report Status 05/21/2013 FINAL   Final     Studies: No results found.  Scheduled Meds: . ALPRAZolam  1 mg Oral TID  . feeding supplement (ENSURE COMPLETE)  237 mL Oral TID WC & HS  . folic acid  1 mg Oral Daily  . metoprolol tartrate  25 mg Oral BID  . multivitamin with minerals  1 tablet Oral Daily  . nicotine  21 mg Transdermal Daily  . pantoprazole  40 mg Oral Q1200  . piperacillin-tazobactam (ZOSYN)  IV  3.375 g Intravenous 3 times per day  . thiamine  100 mg Oral Daily   Continuous Infusions:   Principal Problem:   Abdominal wall abscess at site of surgical wound Active Problems:   Anemia   Tobacco use disorder   Polysubstance abuse   Alcohol abuse   HTN (hypertension)   Hepatitis C   COPD (chronic obstructive pulmonary disease)   Schizoaffective disorder   Bipolar disorder   Protein-calorie malnutrition, severe    Time spent: 25    [facility] Worth[facility]YUOH,Tyrone Kinnard C  [facility] Pager [phone]. If 7PM-7AM, please contact night-coverage at [email or web address], password [email or web address] 05/21/2013, 9:42 AM  LOS: 3 days               this is a 30

## 2013-05-21 NOTE — Discharge Instructions (Signed)
Vacuum-Assisted Closure Therapy  Vacuum-assisted closure (VAC) therapy uses a device that removes fluid and germs from wounds to help them heal. It is used on wounds that cannot be closed with stitches. They often heal slowly. Vacuum-assisted therapy helps the wound stay clean and healthy while the open wound slowly grows back together.  Vacuum-assisted closure therapy uses a bandage (dressing) that is made of foam. It is put inside the wound. Then, a drape is placed over the wound. This drape sticks to your skin to keep air out, and to protect the wound. A tube is hooked up to a small pump and is attached to the drape. The pump sucks out the fluid and germs. Vacuum-assisted closure therapy can also help reduce the bad smell that comes from the wound.  HOW DOES IT WORK?   The vacuum pump pulls fluid through the foam dressing. The dressing may wrinkle during this process. The fluid goes into the tube and away from the wound. The fluid then goes into a container. The fluid in the container must be replaced if it is full or at least once a week, even if the container is not full. The pulling from the pump helps to close the wound and bring better circulation to the wound area.  The foam dressing covers and protects the wound. It helps your wound heal faster.   HOW DOES IT FEEL?   · You might feel a little pulling when the pump is on.  · You might also feel a mild vibrating sensation.    · You might feel some discomfort when the dressing is taken off.  CAN I MOVE AROUND WITH VACUUM-ASSISTED CLOSURE THERAPY?  Yes, it has a backup battery which is used when the machine is not plugged in, as long as the battery is working, you can move freely.  WHAT ARE SOME THINGS I MUST KNOW?  · Do not turn off the pump yourself, unless instructed to do so by your healthcare provider, such as for bathing.  · Do not take off the dressing yourself, unless instructed to do so by your caregiver.  · You can wash or shower with the dressing.  However, do not take the pump into the shower. Make sure the wound dressing is protected and covered with plastic. The wound area must stay dry.  · Do not turn off the pump for more than 2 hours. If the pump is off for more than 2 hours, your nurse must change your dressing.  · Check frequently that the machine is on, that the machine indicates the therapy is on, and that all clamps are open.  THE ALARM IS SOUNDING! WHAT SHOULD I DO?   · Stay calm.  · Do not turn off the pump or do anything with the dressing.  · Call your clinic or caregiver right away if the alarm goes off and you cannot fix the problem. Some reasons the alarm might go off include:  · The fluid collection container is full.  · The battery is low.  · The dressing has a leak.  · Explain to your caregiver what is happening. Follow the instructions you receive.  WHEN SHOULD I CALL FOR HELP?   · You have severe pain.  · You have difficulty breathing.  · You have bleeding that will not stop.  · Your wound smells bad.  · You have redness, swelling, or fluid leaking from your wound.  · Your alarm goes off and you do not know what to do.  ·   You have a fever.  · Your wound itches severely.  · Your dressing changes are often painful or bleeding often occurs.  · You have diarrhea.  · You have a sore throat.  · You have a rash around the dressing or anywhere else on your body.  · You feel nauseous.  · You feel dizzy or weak.  · The VAC machine has been off for more than 2 hours.  HOW DO I GET READY TO GO HOME WITH A PUMP?   A trained caregiver will talk to you and answer your questions about your vacuum-assisted closure therapy before you go home. He or she will explain what to expect. A caregiver will come to your home to apply the pump and care for your wound.  The at-home caregiver will be available for questions and will come back for the scheduled dressing changes, usually every 48-72 hours (or more often for severely infected wounds). Your at-home  caregiver will also come if you are having an unexpected problem. If you have questions or do not know what to do when you go home, talk to your healthcare provider.  Document Released: 02/11/2008 Document Revised: 10/31/2012 Document Reviewed: 02/11/2011  [employer]® Patient Information ©2014 [employer].

## 2013-05-21 NOTE — Progress Notes (Signed)
I have seen and examined the patient and agree with the assessment and plans.  Whitley Patchen A. Delorice Bannister  MD, FACS  

## 2013-05-23 ENCOUNTER — Telehealth (INDEPENDENT_AMBULATORY_CARE_PROVIDER_SITE_OTHER): Payer: Self-pay | Admitting: *Deleted

## 2013-05-23 NOTE — Telephone Encounter (Signed)
Tyrone [name] with [facility] called to report that patient is refusing to wear the wound vac and requesting to go back to wet to dry dressing changes.  Due to Dr. Lindie Spruce being unavailable I spoke to Dr. Abbey Chatters in office who states if the patient is agreeable with wet to dry then to return to doing that.  Tyrone [name] states understanding and agreeable.

## 2013-05-24 ENCOUNTER — Emergency Department (HOSPITAL_COMMUNITY)
Admission: EM | Admit: 2013-05-24 | Discharge: 2013-05-24 | Disposition: A | Discharge status: Home / Self Care | Payer: Non-veteran care | Attending: Emergency Medicine | Admitting: Emergency Medicine

## 2013-05-24 ENCOUNTER — Encounter (HOSPITAL_COMMUNITY): Payer: Self-pay | Admitting: Emergency Medicine

## 2013-05-24 DIAGNOSIS — L03319 Cellulitis of trunk, unspecified: Principal | ICD-10-CM

## 2013-05-24 DIAGNOSIS — Z8719 Personal history of other diseases of the digestive system: Secondary | ICD-10-CM | POA: Insufficient documentation

## 2013-05-24 DIAGNOSIS — Z8673 Personal history of transient ischemic attack (TIA), and cerebral infarction without residual deficits: Secondary | ICD-10-CM | POA: Insufficient documentation

## 2013-05-24 DIAGNOSIS — F319 Bipolar disorder, unspecified: Secondary | ICD-10-CM | POA: Insufficient documentation

## 2013-05-24 DIAGNOSIS — Z8619 Personal history of other infectious and parasitic diseases: Secondary | ICD-10-CM | POA: Insufficient documentation

## 2013-05-24 DIAGNOSIS — Z9861 Coronary angioplasty status: Secondary | ICD-10-CM | POA: Insufficient documentation

## 2013-05-24 DIAGNOSIS — F17200 Nicotine dependence, unspecified, uncomplicated: Secondary | ICD-10-CM | POA: Insufficient documentation

## 2013-05-24 DIAGNOSIS — I252 Old myocardial infarction: Secondary | ICD-10-CM | POA: Insufficient documentation

## 2013-05-24 DIAGNOSIS — Z87820 Personal history of traumatic brain injury: Secondary | ICD-10-CM | POA: Insufficient documentation

## 2013-05-24 DIAGNOSIS — Z79899 Other long term (current) drug therapy: Secondary | ICD-10-CM | POA: Insufficient documentation

## 2013-05-24 DIAGNOSIS — L02219 Cutaneous abscess of trunk, unspecified: Secondary | ICD-10-CM | POA: Insufficient documentation

## 2013-05-24 DIAGNOSIS — Z8659 Personal history of other mental and behavioral disorders: Secondary | ICD-10-CM | POA: Insufficient documentation

## 2013-05-24 DIAGNOSIS — J4489 Other specified chronic obstructive pulmonary disease: Secondary | ICD-10-CM | POA: Insufficient documentation

## 2013-05-24 DIAGNOSIS — Z792 Long term (current) use of antibiotics: Secondary | ICD-10-CM | POA: Insufficient documentation

## 2013-05-24 DIAGNOSIS — T8149XA Infection following a procedure, other surgical site, initial encounter: Secondary | ICD-10-CM

## 2013-05-24 DIAGNOSIS — J449 Chronic obstructive pulmonary disease, unspecified: Secondary | ICD-10-CM | POA: Insufficient documentation

## 2013-05-24 DIAGNOSIS — I10 Essential (primary) hypertension: Secondary | ICD-10-CM | POA: Insufficient documentation

## 2013-05-24 MED ORDER — MORPHINE SULFATE 4 MG/ML IJ SOLN
6.0000 mg | Freq: Once | INTRAMUSCULAR | Status: AC
  Administered 2013-05-24: 6 mg via INTRAMUSCULAR
  Filled 2013-05-24: qty 2

## 2013-05-24 NOTE — ED Notes (Signed)
Pt reports recent abdominal surgery and had a wound vac placed. Pt accidentally stepped on wound vac drain and pulled it out of his abdomen. Pt has an open wound in mid abdomen with small amount of serous drainage. Pt also complaining of pain on left side of incision and was told he has an abscess there. Pt states he is out of his pain medicine and needs wound care/vac for his wound. Pt has an appt with his surgeon this coming Tuesday.

## 2013-05-24 NOTE — ED Notes (Signed)
Covered open wound to abdomen with 4x4's to help keep clean.

## 2013-05-24 NOTE — ED Notes (Signed)
Pt. requesting evaluation of his abdominal surgical incision , pt. stated packing/ dressing / drain came off 2 days ago  . No bleeding or drainage .

## 2013-05-24 NOTE — ED Notes (Signed)
Patient was denied any prescription for narcotic pain medication. After resident left the room the patient removed himself from the monitor, dressed himself and left the room before any discharge paper were ever finished or given to him.

## 2013-05-24 NOTE — ED Provider Notes (Signed)
CSN: [id number]     Arrival date & time 05/24/13  2003 History   First MD Initiated Contact with Patient 05/24/13 2012     Chief Complaint  Patient presents with  . Wound Check     (Consider location/radiation/quality/duration/timing/severity/associated sxs/prior Treatment) HPI Comments: Pt was admitted to hospital for abdominal wall abscess and discharged on 03/10. Op note reviewed shoes bedside I&D by general surgery, not taken to OR. Pt poor historian. Pt states he was discharged, and the wound vac fell off and than he has been putting bandages on his wound. EMR staets that he refused to wear wound vac as of yesterday. Pt states he has some pain that hasnt changed, denies discharge from wound, no fever, no n/v/d. He states he went to [facility] ED today and that they told himt o come here since his surgery was here.  Patient is a 58 y.o. male presenting with wound check. The history is provided by the patient.  Wound Check This is a new problem. The current episode started in the past 7 days. The problem occurs constantly. The problem has been unchanged. Associated symptoms include abdominal pain. Pertinent negatives include no anorexia, chest pain, congestion, coughing, diaphoresis, fever, nausea, numbness, rash, vertigo, vomiting or weakness. Nothing aggravates the symptoms. He has tried nothing for the symptoms. The treatment provided no relief.    Past Medical History  Diagnosis Date  . Myocardial infarction     2008  . Polysubstance abuse   . Alcohol abuse   . CVA (cerebral infarction)   . HTN (hypertension)   . Hepatitis C   . COPD (chronic obstructive pulmonary disease)   . Ischemic cardiomyopathy   . Schizoaffective disorder   . Bipolar disorder   . TBI (traumatic brain injury)   . GI bleed    Past Surgical History  Procedure Laterality Date  . Splenectomy    . Coronary angioplasty with stent placement    . Esophagogastroduodenoscopy N/A 04/26/2013    Procedure:  ESOPHAGOGASTRODUODENOSCOPY (EGD);  Surgeon: Graylin Shiver, MD;  Location: [facility] ENDOSCOPY;  Service: Endoscopy;  Laterality: N/A;  . Laparotomy N/A 04/26/2013    Procedure: EXPLORATORY LAPAROTOMY, OVERSEW OF GASTRIC AV MALFORMATION, REPAIR OF GASTROTOMY;  Surgeon: Cherylynn Ridges, MD;  Location: [facility] OR;  Service: General;  Laterality: N/A;  . Craniotomy     No family history on file. History  Substance Use Topics  . Smoking status: Current Every Day Smoker -- 1.00 packs/day for 40 years    Types: Cigarettes  . Smokeless tobacco: Not on file  . Alcohol Use: No    Review of Systems  Constitutional: Negative for fever, diaphoresis, activity change and appetite change.  HENT: Negative for congestion and rhinorrhea.   Eyes: Negative for discharge and itching.  Respiratory: Negative for cough, shortness of breath and wheezing.   Cardiovascular: Negative for chest pain.  Gastrointestinal: Positive for abdominal pain. Negative for nausea, vomiting, diarrhea, constipation and anorexia.  Genitourinary: Negative for hematuria, decreased urine volume and difficulty urinating.  Skin: Positive for wound. Negative for rash.  Neurological: Negative for vertigo, syncope, weakness and numbness.  All other systems reviewed and are negative.      Allergies  Nitroglycerin; Toradol; and Ultram  Home Medications   Current Outpatient Rx  Name  Route  Sig  Dispense  Refill  . ALPRAZolam (XANAX) 1 MG tablet   Oral   Take 1 mg by mouth 3 (three) times daily.         Marland Kitchen  amoxicillin-clavulanate (AUGMENTIN) 875-125 MG per tablet   Oral   Take 1 tablet by mouth 2 (two) times daily.         Marland Kitchen. HYDROcodone-acetaminophen (NORCO) 10-325 MG per tablet   Oral   Take 1 tablet by mouth every 6 (six) hours as needed for moderate pain.         . metoprolol tartrate (LOPRESSOR) 25 MG tablet   Oral   Take 25 mg by mouth 2 (two) times daily.          BP 152/82  Pulse 90  Temp(Src) 98.7 F (37.1 C) (Oral)   Resp 34  SpO2 99% Physical Exam  Vitals reviewed. Constitutional: He is oriented to person, place, and time. He appears well-developed and well-nourished. No distress.  HENT:  Head: Normocephalic and atraumatic.  Mouth/Throat: Oropharynx is clear and moist. No oropharyngeal exudate.  Eyes: Conjunctivae and EOM are normal. Pupils are equal, round, and reactive to light. Right eye exhibits no discharge. Left eye exhibits no discharge. No scleral icterus.  Neck: Normal range of motion. Neck supple.  Cardiovascular: Normal rate, regular rhythm, normal heart sounds and intact distal pulses.  Exam reveals no gallop and no friction rub.   No murmur heard. Pulmonary/Chest: Effort normal and breath sounds normal. No respiratory distress. He has no wheezes. He has no rales.  Abdominal: Soft. He exhibits no distension and no mass. There is tenderness.  Pt with 4 x 5 cm wound that appears to be healing well. No evidence of superinfection, no surrounding erythema, fluctuance, or induration. Wound with no purulence. Mild ttp around wound without crepitus.  Musculoskeletal: Normal range of motion.  Neurological: He is alert and oriented to person, place, and time. No cranial nerve deficit. He exhibits normal muscle tone. Coordination normal.  Skin: Skin is warm. No rash noted. He is not diaphoretic.    ED Course  Procedures (including critical care time) Labs Review Labs Reviewed - No data to display Imaging Review No results found.   EKG Interpretation None      MDM   MDM: Pt is a 58 y.o. WM w/ PMHx of recent hospitalization for abd wall abscess w/ cc: of wound check. Pt recently admitted, discharged on 03/10 for abd wall abscess. Drained at bedside while in hospital. Pt refusing to wear wound vac, comes today for wound check. Denies any change in pain, f/c, n/v/d. Having some abd pain from wound. No discharge on wound. AFVSS, well appearing, wound healing well without superinfection. Has f/u  w/ surgeon on 03/17 per EMR. Pt requesting pain meds stating that he wants Lortab because Percocet doenst work. Pt was given Oxycodone rx on discharge that he states he never filled because it would "make him sick." EMR states that patient attempted to on discharge but insurance wouldn't pay for it as he had multiple rxs from multiple providers so he never filled it. Pt denies this. I informed patient that I would not be filling Lortab and he states "well just refill my Percocet then" after he previously stated it would make him sick. I informed him I would not fill that and offered Ibuprofen, Tylenol, Diclofenac, Naproxyn to which he all declined. I suspect an element of narcotic seeking behavior in this patient, as once he learned he would not be getting an rx for narcs, he began to remove monitoring equipment and started getting dressed. He then eloped without receiving final discharge instructions. Care of case d/w my attending.  Final diagnoses:  Abdominal  wall abscess at site of surgical wound    Discharged    Pilar Jarvis, MD 05/24/13 2239

## 2013-05-24 NOTE — Discharge Instructions (Signed)
Abscess  Care After  An abscess (also called a boil or furuncle) is an infected area that contains a collection of pus. Signs and symptoms of an abscess include pain, tenderness, redness, or hardness, or you may feel a moveable soft area under your skin. An abscess can occur anywhere in the body. The infection may spread to surrounding tissues causing cellulitis. A cut (incision) by the surgeon was made over your abscess and the pus was drained out. Gauze may have been packed into the space to provide a drain that will allow the cavity to heal from the inside outwards. The boil may be painful for 5 to 7 days. Most people with a boil do not have high fevers. Your abscess, if seen early, may not have localized, and may not have been lanced. If not, another appointment may be required for this if it does not get better on its own or with medications.  HOME CARE INSTRUCTIONS   · Only take over-the-counter or prescription medicines for pain, discomfort, or fever as directed by your caregiver.  · When you bathe, soak and then remove gauze or iodoform packs at least daily or as directed by your caregiver. You may then wash the wound gently with mild soapy water. Repack with gauze or do as your caregiver directs.  SEEK IMMEDIATE MEDICAL CARE IF:   · You develop increased pain, swelling, redness, drainage, or bleeding in the wound site.  · You develop signs of generalized infection including muscle aches, chills, fever, or a general ill feeling.  · An oral temperature above 102° F (38.9° C) develops, not controlled by medication.  See your caregiver for a recheck if you develop any of the symptoms described above. If medications (antibiotics) were prescribed, take them as directed.  Document Released: 09/16/2004 Document Revised: 05/23/2011 Document Reviewed: 05/14/2007  [employer]® Patient Information ©2014 [employer].

## 2013-05-26 NOTE — ED Provider Notes (Signed)
I saw and evaluated the patient, reviewed the resident's note and I agree with the findings and plan.   EKG Interpretation None      Patient's wound is well appearing. Per notes he took off his wound vac and has refused to wear it. He states it "fell out". He seems most concerned that he doesn't have pain meds. Looking in chart it seems his Oxycodone Rx was rejected by pharmacy because of multiple Rx's by multiple providers. Due to this will not refill here. Patient's abd otherwise looks well, no drainage, erythema or other concerning symptoms. Will do wet-to-dry's as suggested by surgery in the chart notes.  Audree Camel, MD 05/26/13 [phone]

## 2013-05-27 ENCOUNTER — Telehealth (INDEPENDENT_AMBULATORY_CARE_PROVIDER_SITE_OTHER): Payer: Self-pay | Admitting: *Deleted

## 2013-05-27 NOTE — Telephone Encounter (Signed)
Pt called requesting refill of pain med.  I checked and he is scheduled to see Dr. Lindie Spruce 05/28/13 anyway so i advised pt he could just get his refill at the appt.  Pt states he may not have a ride to his apt, but was also advised that he would have to pick up the Rx anyway and if he didn't have a ride, then there was no since in requesting a Rx for pain  Med because it has to be picked up.  Pt came back and said he found a ride to his appt and I advised he would get his refill then if Dr. Lindie Spruce agreed.  Pt understands and is agreeance.[name]

## 2013-05-28 ENCOUNTER — Encounter (INDEPENDENT_AMBULATORY_CARE_PROVIDER_SITE_OTHER): Payer: Medicare Other | Admitting: General Surgery

## 2013-05-29 ENCOUNTER — Encounter (INDEPENDENT_AMBULATORY_CARE_PROVIDER_SITE_OTHER): Payer: Self-pay | Admitting: General Surgery

## 2013-05-29 ENCOUNTER — Ambulatory Visit (INDEPENDENT_AMBULATORY_CARE_PROVIDER_SITE_OTHER): Payer: Medicare Other | Admitting: General Surgery

## 2013-05-29 VITALS — BP 134/74 | HR 76 | Temp 98.3°F | Resp 16 | Ht 70.0 in | Wt 153.0 lb

## 2013-05-29 DIAGNOSIS — T8149XA Infection following a procedure, other surgical site, initial encounter: Secondary | ICD-10-CM

## 2013-05-29 DIAGNOSIS — K922 Gastrointestinal hemorrhage, unspecified: Secondary | ICD-10-CM

## 2013-05-29 DIAGNOSIS — L03319 Cellulitis of trunk, unspecified: Secondary | ICD-10-CM

## 2013-05-29 DIAGNOSIS — L02219 Cutaneous abscess of trunk, unspecified: Secondary | ICD-10-CM

## 2013-05-29 NOTE — Progress Notes (Signed)
Patient ID: Tyrone [name], male   DOB: May 18, 1955, 58 y.o.   MRN: [id number] History: This patient underwent emergent laparotomy, oversew of gastric AV malformation, and repair of gastrotomy by Dr. Lindie Spruce on 04/25/2013. He was discharged home about 3 weeks later having been primarily taking care of by the medical staff hospitalist service. He had an appointment with Dr. Lindie Spruce yesterday but did not make that appointment due to transportation issues. He was placed in the urgent office with me today. He is doing reasonably well. Tolerating diet fairly well. No nausea or vomiting. Having bowel movements. His the nurses are helping him with his abdominal wound. He is asking for pain medicine and anxiety medicine and medicine for his bipolar disorder. I declined to refill any prescriptions for him.  Exam: deconditioned gentleman with rambling speech and labile affect.  in no distress.  abdomen soft scaphoid nontender. Midline wound is healed with a small open area that is granulating in. I debrided some of the PDS sutures. No hernia. Redressed  Assessment: Upper GI bleed, 5 weeks postop gastrotomy and oversew bleeding AV malformation. He seems to be doing reasonably well surgically. Anticipate wound will heal by secondary intention Tobacco use disorder Polysubstance abuse Alcohol abuse Hypertension Hepatitis C COPD Schizoaffective disorder Bipolar disorder Protein calorie malnutrition  Plan: Visiting nurses to continue daily wound care Diet and activities discussed I declined to fill any prescription for him. This will need to be handled by his PCP or the free clinic Return to see Dr. Lindie Spruce in 3 weeks.   Angelia Mould. Derrell Lolling, M.D., [facility] General and Minimally invasive Surgery Breast and Colorectal Surgery Office:   [phone] Pager:   [phone]

## 2013-05-29 NOTE — Patient Instructions (Signed)
Your abdominal wound is healing normally. Tell the visiting nurse to continue to pack it every day. You may take a shower daily  I encourage you to establish youself with a primary care physician, or go to the free clinic  Return to see Dr. Lindie Spruce in 3 weeks

## 2013-05-31 ENCOUNTER — Telehealth (INDEPENDENT_AMBULATORY_CARE_PROVIDER_SITE_OTHER): Payer: Self-pay | Admitting: *Deleted

## 2013-05-31 NOTE — Telephone Encounter (Signed)
Patient called into the office asking for a refill of his pain medication.  Patient states he did not get the refill while he was in office and was told he needed to get the prescription from his surgeon.  Patient was unable to see Dr. Lindie Spruce on Tuesday due to transportation issues and had to be seen by Dr. Derrell Lolling who declined to fill any prescriptions.  Explained that I would send a message to Dr. Lindie Spruce to ask his opinion on this then we will let him know.  I explained to the patient that there is a 24 hour turn around for prescriptions but we would update him as soon as we had a decision.  Patient states understanding and agreeable at this time.  Marcelino Duster aware of the situation and has text Dr. Lindie Spruce with the request.

## 2013-05-31 NOTE — Telephone Encounter (Signed)
Spoke to Dr. Lindie Spruce who said he would consider a refill at patient's next appt if appropriate.  Patient advised that appt is scheduled for 06/25/13 to arrive @ 10:10a.  Patient states he is going to call the cab company he uses so hopefully he can be here for that appt.  Patient states understanding of the plan and agreeable at this time.

## 2013-06-20 ENCOUNTER — Encounter (INDEPENDENT_AMBULATORY_CARE_PROVIDER_SITE_OTHER): Payer: Medicare Other | Admitting: General Surgery

## 2013-06-25 ENCOUNTER — Encounter (INDEPENDENT_AMBULATORY_CARE_PROVIDER_SITE_OTHER): Payer: Self-pay | Admitting: General Surgery

## 2013-06-25 ENCOUNTER — Ambulatory Visit (INDEPENDENT_AMBULATORY_CARE_PROVIDER_SITE_OTHER): Payer: Medicare Other | Admitting: General Surgery

## 2013-06-25 ENCOUNTER — Encounter (INDEPENDENT_AMBULATORY_CARE_PROVIDER_SITE_OTHER): Payer: Self-pay

## 2013-06-25 VITALS — BP 124/72 | HR 80 | Temp 97.2°F | Resp 14 | Ht 70.0 in | Wt 150.7 lb

## 2013-06-25 DIAGNOSIS — Z09 Encounter for follow-up examination after completed treatment for conditions other than malignant neoplasm: Secondary | ICD-10-CM | POA: Insufficient documentation

## 2013-06-25 MED ORDER — HYDROCODONE-ACETAMINOPHEN 5-325 MG PO TABS: 1.0000 | ORAL_TABLET | ORAL | Status: DC | PRN

## 2013-06-25 NOTE — Progress Notes (Signed)
Subjective:     Patient ID: Tyrone [name], male   DOB: 04-09-1955, 58 y.o.   MRN: [id number]  HPI The patient is complaining of abdominal pain but looks very good. He smells of tobacco.  Review of Systems No fevers or chills, no nausea vomiting.    Objective:   Physical Exam On examination his wound is healed well with only a 1 cm opening in the upper part which does not require any wet to dry dressings anymore. This can be treated with a shower and triple antibiotic ointment and 4 x 4.  He got excellent bowel sounds. He has no rebound or guarding no peritonitis.    Assessment:     Excellent healing postop wound infection from a gastrotomy for a bleeding gastric AVM.     Plan:     I will prescribe the patient 20 Norco tablets. This will be his last prescription. He is to return to see me on an as-needed basis.

## 2013-06-27 ENCOUNTER — Telehealth (INDEPENDENT_AMBULATORY_CARE_PROVIDER_SITE_OTHER): Payer: Self-pay

## 2013-06-27 NOTE — Telephone Encounter (Signed)
Pt is s/p abdominal wall abscess by Dr. Lindie Spruce.  Home health nurse states the pt is non-compliant and they have not been able to see him to change his dressings.  Dr. Dixon Boos last office note stated that the wound was almost completely healed and no required wet/dry dressings, just antibiotic ointment and dry gauze.  Verbal order given to d/c home health visits at this time.

## 2013-08-23 ENCOUNTER — Emergency Department (HOSPITAL_COMMUNITY)
Admission: EM | Admit: 2013-08-23 | Discharge: 2013-08-23 | Disposition: A | Discharge status: Home / Self Care | Payer: Medicare Other | Attending: Emergency Medicine | Admitting: Emergency Medicine

## 2013-08-23 ENCOUNTER — Encounter (HOSPITAL_COMMUNITY): Payer: Self-pay | Admitting: Emergency Medicine

## 2013-08-23 DIAGNOSIS — I10 Essential (primary) hypertension: Secondary | ICD-10-CM | POA: Insufficient documentation

## 2013-08-23 DIAGNOSIS — I252 Old myocardial infarction: Secondary | ICD-10-CM | POA: Insufficient documentation

## 2013-08-23 DIAGNOSIS — J449 Chronic obstructive pulmonary disease, unspecified: Secondary | ICD-10-CM | POA: Insufficient documentation

## 2013-08-23 DIAGNOSIS — R109 Unspecified abdominal pain: Secondary | ICD-10-CM | POA: Diagnosis present

## 2013-08-23 DIAGNOSIS — Z87891 Personal history of nicotine dependence: Secondary | ICD-10-CM | POA: Insufficient documentation

## 2013-08-23 DIAGNOSIS — J4489 Other specified chronic obstructive pulmonary disease: Secondary | ICD-10-CM | POA: Insufficient documentation

## 2013-08-23 LAB — CBC WITH DIFFERENTIAL/PLATELET
Basophils Absolute: 0.1 10*3/uL (ref 0.0–0.1)
Basophils Relative: 1 % (ref 0–1)
EOS ABS: 0.2 10*3/uL (ref 0.0–0.7)
EOS PCT: 2 % (ref 0–5)
HCT: 39.7 % (ref 39.0–52.0)
Hemoglobin: 13.4 g/dL (ref 13.0–17.0)
LYMPHS ABS: 5.6 10*3/uL — AB (ref 0.7–4.0)
Lymphocytes Relative: 50 % — ABNORMAL HIGH (ref 12–46)
MCH: 31.1 pg (ref 26.0–34.0)
MCHC: 33.8 g/dL (ref 30.0–36.0)
MCV: 92.1 fL (ref 78.0–100.0)
Monocytes Absolute: 1.1 10*3/uL — ABNORMAL HIGH (ref 0.1–1.0)
Monocytes Relative: 10 % (ref 3–12)
Neutro Abs: 4.1 10*3/uL (ref 1.7–7.7)
Neutrophils Relative %: 37 % — ABNORMAL LOW (ref 43–77)
Platelets: 236 10*3/uL (ref 150–400)
RBC: 4.31 MIL/uL (ref 4.22–5.81)
RDW: 13.9 % (ref 11.5–15.5)
WBC: 11.1 10*3/uL — ABNORMAL HIGH (ref 4.0–10.5)

## 2013-08-23 LAB — COMPREHENSIVE METABOLIC PANEL
ALT: 27 U/L (ref 0–53)
AST: 29 U/L (ref 0–37)
Albumin: 4 g/dL (ref 3.5–5.2)
Alkaline Phosphatase: 73 U/L (ref 39–117)
BUN: 20 mg/dL (ref 6–23)
CALCIUM: 9.4 mg/dL (ref 8.4–10.5)
CO2: 23 mEq/L (ref 19–32)
Chloride: 105 mEq/L (ref 96–112)
Creatinine, Ser: 0.93 mg/dL (ref 0.50–1.35)
GFR calc non Af Amer: >90 mL/min (ref >90)
GLUCOSE: 106 mg/dL — AB (ref 70–99)
Potassium: 4.6 mEq/L (ref 3.7–5.3)
Sodium: 141 mEq/L (ref 137–147)
Total Bilirubin: 0.3 mg/dL (ref 0.3–1.2)
Total Protein: 7.3 g/dL (ref 6.0–8.3)

## 2013-08-23 LAB — TROPONIN I

## 2013-08-23 LAB — LIPASE, BLOOD: Lipase: 93 U/L — ABNORMAL HIGH (ref 11–59)

## 2013-08-23 NOTE — ED Notes (Signed)
Pt back and wants registration to call [facility] ED and see if they will pay for him to have a "Medicaid cab" back to [place], [place] if he comes there to be seen.  Registration gave pt phone to call [facility].  Encouraged pt to stay for eval and pt states again that he is leaving.

## 2013-08-23 NOTE — ED Notes (Signed)
Pt is back and has been updated again regarding wait for treatment room.

## 2013-08-23 NOTE — ED Notes (Signed)
Pt came to registration desk and asked Herbert Deaner, triage RN why she did not tell him there were 9 pts in front of him when he was in a triage room.  Pt states he is going to [facility] and will come back later since there are 9 in front of him.  Thayer Ohm encouraged pt to stay and advised he should stay here instead of going to [facility].  Pt went and sat back down.
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
## 2013-08-23 NOTE — ED Notes (Signed)
Pt left his pager in seat and went outside.

## 2013-08-23 NOTE — ED Notes (Signed)
Pt speaking with nursing secretary and states that his ride has to leave.  Pt wanting to know if we can bill Medicaid for a cab ride to [place], [place] once he is discharged.  RN spoke with pt and encouraged him to stay but informed him that we were not able to provide transportation to [place], [place].  Pt states that since there is 8 people ahead of him he cannot wait to be seen. Labs reviewed. Pt turned in his pager to NS and left.

## 2013-08-23 NOTE — ED Notes (Signed)
Unable to void he just voided when he walked into the  Waiting room

## 2013-08-25 ENCOUNTER — Emergency Department (HOSPITAL_COMMUNITY): Payer: No Typology Code available for payment source

## 2013-08-25 ENCOUNTER — Encounter (HOSPITAL_COMMUNITY): Payer: Self-pay | Admitting: Emergency Medicine

## 2013-08-25 ENCOUNTER — Emergency Department (HOSPITAL_COMMUNITY)
Admission: EM | Admit: 2013-08-25 | Discharge: 2013-08-25 | Disposition: A | Discharge status: Home / Self Care | Payer: No Typology Code available for payment source | Attending: Emergency Medicine | Admitting: Emergency Medicine

## 2013-08-25 DIAGNOSIS — Z87891 Personal history of nicotine dependence: Secondary | ICD-10-CM | POA: Diagnosis not present

## 2013-08-25 DIAGNOSIS — Z9861 Coronary angioplasty status: Secondary | ICD-10-CM | POA: Diagnosis not present

## 2013-08-25 DIAGNOSIS — Z87820 Personal history of traumatic brain injury: Secondary | ICD-10-CM | POA: Diagnosis not present

## 2013-08-25 DIAGNOSIS — Z8659 Personal history of other mental and behavioral disorders: Secondary | ICD-10-CM | POA: Insufficient documentation

## 2013-08-25 DIAGNOSIS — Z7982 Long term (current) use of aspirin: Secondary | ICD-10-CM | POA: Insufficient documentation

## 2013-08-25 DIAGNOSIS — Z9889 Other specified postprocedural states: Secondary | ICD-10-CM | POA: Insufficient documentation

## 2013-08-25 DIAGNOSIS — Y92410 Unspecified street and highway as the place of occurrence of the external cause: Secondary | ICD-10-CM | POA: Insufficient documentation

## 2013-08-25 DIAGNOSIS — S3981XA Other specified injuries of abdomen, initial encounter: Secondary | ICD-10-CM | POA: Insufficient documentation

## 2013-08-25 DIAGNOSIS — R109 Unspecified abdominal pain: Secondary | ICD-10-CM

## 2013-08-25 DIAGNOSIS — Y9389 Activity, other specified: Secondary | ICD-10-CM | POA: Diagnosis not present

## 2013-08-25 DIAGNOSIS — Z8619 Personal history of other infectious and parasitic diseases: Secondary | ICD-10-CM | POA: Diagnosis not present

## 2013-08-25 DIAGNOSIS — I252 Old myocardial infarction: Secondary | ICD-10-CM | POA: Insufficient documentation

## 2013-08-25 DIAGNOSIS — J449 Chronic obstructive pulmonary disease, unspecified: Secondary | ICD-10-CM | POA: Insufficient documentation

## 2013-08-25 DIAGNOSIS — Z8719 Personal history of other diseases of the digestive system: Secondary | ICD-10-CM | POA: Insufficient documentation

## 2013-08-25 DIAGNOSIS — J4489 Other specified chronic obstructive pulmonary disease: Secondary | ICD-10-CM | POA: Insufficient documentation

## 2013-08-25 LAB — CBC WITH DIFFERENTIAL/PLATELET
Basophils Absolute: 0.1 10*3/uL (ref 0.0–0.1)
Basophils Relative: 1 % (ref 0–1)
EOS PCT: 2 % (ref 0–5)
Eosinophils Absolute: 0.2 10*3/uL (ref 0.0–0.7)
HCT: 38 % — ABNORMAL LOW (ref 39.0–52.0)
Hemoglobin: 12.9 g/dL — ABNORMAL LOW (ref 13.0–17.0)
LYMPHS ABS: 6.1 10*3/uL — AB (ref 0.7–4.0)
LYMPHS PCT: 61 % — AB (ref 12–46)
MCH: 31.2 pg (ref 26.0–34.0)
MCHC: 33.9 g/dL (ref 30.0–36.0)
MCV: 92 fL (ref 78.0–100.0)
Monocytes Absolute: 0.9 10*3/uL (ref 0.1–1.0)
Monocytes Relative: 9 % (ref 3–12)
NEUTROS ABS: 2.7 10*3/uL (ref 1.7–7.7)
Neutrophils Relative %: 27 % — ABNORMAL LOW (ref 43–77)
PLATELETS: 245 10*3/uL (ref 150–400)
RBC: 4.13 MIL/uL — AB (ref 4.22–5.81)
RDW: 13.8 % (ref 11.5–15.5)
WBC: 10 10*3/uL (ref 4.0–10.5)

## 2013-08-25 LAB — COMPREHENSIVE METABOLIC PANEL
ALT: 25 U/L (ref 0–53)
AST: 30 U/L (ref 0–37)
Albumin: 3.8 g/dL (ref 3.5–5.2)
Alkaline Phosphatase: 69 U/L (ref 39–117)
BILIRUBIN TOTAL: 0.6 mg/dL (ref 0.3–1.2)
BUN: 10 mg/dL (ref 6–23)
CHLORIDE: 103 meq/L (ref 96–112)
CO2: 26 meq/L (ref 19–32)
CREATININE: 1.2 mg/dL (ref 0.50–1.35)
Calcium: 9.7 mg/dL (ref 8.4–10.5)
GFR calc Af Amer: 75 mL/min — ABNORMAL LOW (ref >90)
GFR, EST NON AFRICAN AMERICAN: 65 mL/min — AB (ref >90)
Glucose, Bld: 96 mg/dL (ref 70–99)
POTASSIUM: 4.4 meq/L (ref 3.7–5.3)
Sodium: 142 mEq/L (ref 137–147)
Total Protein: 7.1 g/dL (ref 6.0–8.3)

## 2013-08-25 LAB — URINALYSIS, ROUTINE W REFLEX MICROSCOPIC
Bilirubin Urine: NEGATIVE
Glucose, UA: NEGATIVE mg/dL
HGB URINE DIPSTICK: NEGATIVE
KETONES UR: NEGATIVE mg/dL
Leukocytes, UA: NEGATIVE
Nitrite: NEGATIVE
PROTEIN: NEGATIVE mg/dL
Specific Gravity, Urine: 1.008 (ref 1.005–1.030)
Urobilinogen, UA: 1 mg/dL (ref 0.0–1.0)
pH: 6 (ref 5.0–8.0)

## 2013-08-25 LAB — LIPASE, BLOOD: Lipase: 76 U/L — ABNORMAL HIGH (ref 11–59)

## 2013-08-25 LAB — ETHANOL

## 2013-08-25 LAB — RAPID URINE DRUG SCREEN, HOSP PERFORMED
AMPHETAMINES: NOT DETECTED
BARBITURATES: NOT DETECTED
Benzodiazepines: NOT DETECTED
Cocaine: NOT DETECTED
Opiates: NOT DETECTED
Tetrahydrocannabinol: POSITIVE — AB

## 2013-08-25 LAB — I-STAT CG4 LACTIC ACID, ED: LACTIC ACID, VENOUS: 0.48 mmol/L — AB (ref 0.5–2.2)

## 2013-08-25 MED ORDER — IOHEXOL 300 MG/ML  SOLN
100.0000 mL | Freq: Once | INTRAMUSCULAR | Status: AC | PRN
  Administered 2013-08-25: 100 mL via INTRAVENOUS

## 2013-08-25 MED ORDER — HYDROMORPHONE HCL PF 1 MG/ML IJ SOLN
1.0000 mg | Freq: Once | INTRAMUSCULAR | Status: AC
  Administered 2013-08-25: 1 mg via INTRAVENOUS
  Filled 2013-08-25: qty 1

## 2013-08-25 MED ORDER — SODIUM CHLORIDE 0.9 % IV SOLN
Freq: Once | INTRAVENOUS | Status: AC
  Administered 2013-08-25: 22:00:00 via INTRAVENOUS

## 2013-08-25 MED ORDER — FENTANYL CITRATE 0.05 MG/ML IJ SOLN
50.0000 ug | Freq: Once | INTRAMUSCULAR | Status: AC
  Administered 2013-08-25: 50 ug via INTRAVENOUS
  Filled 2013-08-25: qty 2

## 2013-08-25 NOTE — ED Notes (Signed)
Pt refused dc VS 
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
## 2013-08-25 NOTE — ED Notes (Signed)
Pt prone to wander. Difficult to keep track of this very mobile pt. Wait, plan & process explained. Encouraged to stay in place so as to not be missed.

## 2013-08-25 NOTE — Discharge Instructions (Signed)
1. Medications: usual home medications 2. Treatment: rest, drink plenty of fluids,  3. Follow Up: Please followup with your primary doctor for discussion of your diagnoses and further evaluation after today's visit; if you do not have a primary care doctor use the resource guide provided to find one;     Chronic Pain Chronic pain can be defined as pain that is off and on and lasts for 3 6 months or longer. Many things cause chronic pain, which can make it difficult to make a diagnosis. There are many treatment options available for chronic pain. However, finding a treatment that works well for you may require trying various approaches until the right one is found. Many people benefit from a combination of two or more types of treatment to control their pain. SYMPTOMS  Chronic pain can occur anywhere in the body and can range from mild to very severe. Some types of chronic pain include:  Headache.  Low back pain.  Cancer pain.  Arthritis pain.  Neurogenic pain. This is pain resulting from damage to nerves. People with chronic pain may also have other symptoms such as:  Depression.  Anger.  Insomnia.  Anxiety. DIAGNOSIS  Your health care provider will help diagnose your condition over time. In many cases, the initial focus will be on excluding possible conditions that could be causing the pain. Depending on your symptoms, your health care provider may order tests to diagnose your condition. Some of these tests may include:   Blood tests.   CT scan.   MRI.   X-rays.   Ultrasounds.   Nerve conduction studies.  You may need to see a specialist.  TREATMENT  Finding treatment that works well may take time. You may be referred to a pain specialist. He or she may prescribe medicine or therapies, such as:   Mindful meditation or yoga.  Shots (injections) of numbing or pain-relieving medicines into the spine or area of pain.  Local electrical  stimulation.  Acupuncture.   Massage therapy.   Aroma, color, light, or sound therapy.   Biofeedback.   Working with a physical therapist to keep from getting stiff.   Regular, gentle exercise.   Cognitive or behavioral therapy.   Group support.  Sometimes, surgery may be recommended.  HOME CARE INSTRUCTIONS   Take all medicines as directed by your health care provider.   Lessen stress in your life by relaxing and doing things such as listening to calming music.   Exercise or be active as directed by your health care provider.   Eat a healthy diet and include things such as vegetables, fruits, fish, and lean meats in your diet.   Keep all follow-up appointments with your health care provider.   Attend a support group with others suffering from chronic pain. SEEK MEDICAL CARE IF:   Your pain gets worse.   You develop a new pain that was not there before.   You cannot tolerate medicines given to you by your health care provider.   You have new symptoms since your last visit with your health care provider.  SEEK IMMEDIATE MEDICAL CARE IF:   You feel weak.   You have decreased sensation or numbness.   You lose control of bowel or bladder function.   Your pain suddenly gets much worse.   You develop shaking.  You develop chills.  You develop confusion.  You develop chest pain.  You develop shortness of breath.  MAKE SURE YOU:  Understand these instructions.  Will watch your condition.  Will get help right away if you are not doing well or get worse. Document Released: 11/20/2001 Document Revised: 10/31/2012 Document Reviewed: 08/24/2012 [facility] Patient Information 2014 [employer], [place].

## 2013-08-26 ENCOUNTER — Encounter (HOSPITAL_COMMUNITY): Payer: Self-pay | Admitting: Emergency Medicine

## 2013-08-26 ENCOUNTER — Emergency Department (HOSPITAL_COMMUNITY)
Admission: EM | Admit: 2013-08-26 | Discharge: 2013-08-26 | Disposition: A | Discharge status: Home / Self Care | Payer: Medicare Other | Attending: Emergency Medicine | Admitting: Emergency Medicine

## 2013-08-26 DIAGNOSIS — Z888 Allergy status to other drugs, medicaments and biological substances status: Secondary | ICD-10-CM | POA: Insufficient documentation

## 2013-08-26 DIAGNOSIS — R109 Unspecified abdominal pain: Secondary | ICD-10-CM

## 2013-08-26 DIAGNOSIS — I2589 Other forms of chronic ischemic heart disease: Secondary | ICD-10-CM | POA: Insufficient documentation

## 2013-08-26 DIAGNOSIS — Z8619 Personal history of other infectious and parasitic diseases: Secondary | ICD-10-CM | POA: Insufficient documentation

## 2013-08-26 DIAGNOSIS — J4489 Other specified chronic obstructive pulmonary disease: Secondary | ICD-10-CM | POA: Insufficient documentation

## 2013-08-26 DIAGNOSIS — Z8659 Personal history of other mental and behavioral disorders: Secondary | ICD-10-CM | POA: Insufficient documentation

## 2013-08-26 DIAGNOSIS — F319 Bipolar disorder, unspecified: Secondary | ICD-10-CM | POA: Insufficient documentation

## 2013-08-26 DIAGNOSIS — F1910 Other psychoactive substance abuse, uncomplicated: Secondary | ICD-10-CM | POA: Insufficient documentation

## 2013-08-26 DIAGNOSIS — J449 Chronic obstructive pulmonary disease, unspecified: Secondary | ICD-10-CM | POA: Insufficient documentation

## 2013-08-26 DIAGNOSIS — Z885 Allergy status to narcotic agent status: Secondary | ICD-10-CM | POA: Insufficient documentation

## 2013-08-26 DIAGNOSIS — Z7982 Long term (current) use of aspirin: Secondary | ICD-10-CM | POA: Insufficient documentation

## 2013-08-26 DIAGNOSIS — Z7902 Long term (current) use of antithrombotics/antiplatelets: Secondary | ICD-10-CM | POA: Insufficient documentation

## 2013-08-26 DIAGNOSIS — F1010 Alcohol abuse, uncomplicated: Secondary | ICD-10-CM | POA: Insufficient documentation

## 2013-08-26 DIAGNOSIS — Z87820 Personal history of traumatic brain injury: Secondary | ICD-10-CM | POA: Insufficient documentation

## 2013-08-26 DIAGNOSIS — F259 Schizoaffective disorder, unspecified: Secondary | ICD-10-CM | POA: Insufficient documentation

## 2013-08-26 DIAGNOSIS — Z87891 Personal history of nicotine dependence: Secondary | ICD-10-CM | POA: Insufficient documentation

## 2013-08-26 DIAGNOSIS — I219 Acute myocardial infarction, unspecified: Secondary | ICD-10-CM | POA: Insufficient documentation

## 2013-08-26 DIAGNOSIS — Z8673 Personal history of transient ischemic attack (TIA), and cerebral infarction without residual deficits: Secondary | ICD-10-CM | POA: Insufficient documentation

## 2013-08-26 DIAGNOSIS — I10 Essential (primary) hypertension: Secondary | ICD-10-CM | POA: Insufficient documentation

## 2013-08-26 DIAGNOSIS — K922 Gastrointestinal hemorrhage, unspecified: Secondary | ICD-10-CM | POA: Insufficient documentation

## 2013-08-26 MED ORDER — HYDROCODONE-ACETAMINOPHEN 5-325 MG PO TABS: 1.0000 | ORAL_TABLET | Freq: Three times a day (TID) | ORAL | Status: DC | PRN

## 2013-08-26 MED ORDER — ALPRAZOLAM 1 MG PO TABS: 1.0000 mg | ORAL_TABLET | Freq: Three times a day (TID) | ORAL | Status: DC | PRN

## 2013-08-26 MED ORDER — HYDROCODONE-ACETAMINOPHEN 5-325 MG PO TABS
1.0000 | ORAL_TABLET | Freq: Once | ORAL | Status: AC
  Administered 2013-08-26: 1 via ORAL
  Filled 2013-08-26: qty 1

## 2013-08-26 NOTE — ED Notes (Signed)
MD at bedside. 

## 2013-08-26 NOTE — ED Notes (Signed)
Pt's ride is picking him up from the waiting room
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
## 2013-08-26 NOTE — Discharge Instructions (Signed)
As discussed, it is important that you follow up as soon as possible with your physician for continued management of your condition. ° °If you develop any new, or concerning changes in your condition, please return to the emergency department immediately. ° °

## 2013-08-26 NOTE — ED Notes (Signed)
58 yo male says medicaid cab was supposed to pick him up from here last night but they never came. Here for abd pain and pain meds. Requesting 2 weeks supply of meds. States that he did not receive any prescription last night and threw his papers away.

## 2013-09-03 NOTE — ED Provider Notes (Signed)
Medical screening examination/treatment/procedure(s) were performed by non-physician practitioner and as supervising physician I was immediately available for consultation/collaboration.   EKG Interpretation None        Shanna Cisco, MD 09/03/13 2108

## 2014-01-17 ENCOUNTER — Encounter (HOSPITAL_COMMUNITY): Payer: Self-pay | Admitting: *Deleted

## 2014-01-17 ENCOUNTER — Emergency Department (HOSPITAL_COMMUNITY)
Admission: EM | Admit: 2014-01-17 | Discharge: 2014-01-17 | Disposition: A | Discharge status: Home / Self Care | Payer: Medicare Other | Attending: Emergency Medicine | Admitting: Emergency Medicine

## 2014-01-17 DIAGNOSIS — I10 Essential (primary) hypertension: Secondary | ICD-10-CM | POA: Insufficient documentation

## 2014-01-17 DIAGNOSIS — R1033 Periumbilical pain: Secondary | ICD-10-CM | POA: Diagnosis present

## 2014-01-17 DIAGNOSIS — Z8619 Personal history of other infectious and parasitic diseases: Secondary | ICD-10-CM | POA: Diagnosis not present

## 2014-01-17 DIAGNOSIS — I252 Old myocardial infarction: Secondary | ICD-10-CM | POA: Diagnosis not present

## 2014-01-17 DIAGNOSIS — J449 Chronic obstructive pulmonary disease, unspecified: Secondary | ICD-10-CM | POA: Diagnosis not present

## 2014-01-17 DIAGNOSIS — Z87891 Personal history of nicotine dependence: Secondary | ICD-10-CM | POA: Diagnosis not present

## 2014-01-17 DIAGNOSIS — Z8659 Personal history of other mental and behavioral disorders: Secondary | ICD-10-CM | POA: Insufficient documentation

## 2014-01-17 DIAGNOSIS — R300 Dysuria: Secondary | ICD-10-CM | POA: Diagnosis not present

## 2014-01-17 DIAGNOSIS — Z8673 Personal history of transient ischemic attack (TIA), and cerebral infarction without residual deficits: Secondary | ICD-10-CM | POA: Insufficient documentation

## 2014-01-17 DIAGNOSIS — K439 Ventral hernia without obstruction or gangrene: Secondary | ICD-10-CM | POA: Diagnosis not present

## 2014-01-17 DIAGNOSIS — Z7902 Long term (current) use of antithrombotics/antiplatelets: Secondary | ICD-10-CM | POA: Diagnosis not present

## 2014-01-17 DIAGNOSIS — Z7982 Long term (current) use of aspirin: Secondary | ICD-10-CM | POA: Diagnosis not present

## 2014-01-17 LAB — CBC WITH DIFFERENTIAL/PLATELET
BASOS ABS: 0.1 10*3/uL (ref 0.0–0.1)
Basophils Relative: 1 % (ref 0–1)
EOS PCT: 1 % (ref 0–5)
Eosinophils Absolute: 0.1 10*3/uL (ref 0.0–0.7)
HCT: 37.2 % — ABNORMAL LOW (ref 39.0–52.0)
Hemoglobin: 12.4 g/dL — ABNORMAL LOW (ref 13.0–17.0)
Lymphocytes Relative: 29 % (ref 12–46)
Lymphs Abs: 4 10*3/uL (ref 0.7–4.0)
MCH: 32.1 pg (ref 26.0–34.0)
MCHC: 33.3 g/dL (ref 30.0–36.0)
MCV: 96.4 fL (ref 78.0–100.0)
Monocytes Absolute: 1.5 10*3/uL — ABNORMAL HIGH (ref 0.1–1.0)
Monocytes Relative: 11 % (ref 3–12)
Neutro Abs: 8.1 10*3/uL — ABNORMAL HIGH (ref 1.7–7.7)
Neutrophils Relative %: 58 % (ref 43–77)
PLATELETS: 260 10*3/uL (ref 150–400)
RBC: 3.86 MIL/uL — AB (ref 4.22–5.81)
RDW: 14.3 % (ref 11.5–15.5)
WBC: 13.8 10*3/uL — ABNORMAL HIGH (ref 4.0–10.5)

## 2014-01-17 LAB — COMPREHENSIVE METABOLIC PANEL
ALBUMIN: 3.6 g/dL (ref 3.5–5.2)
ALT: 22 U/L (ref 0–53)
ANION GAP: 12 (ref 5–15)
AST: 28 U/L (ref 0–37)
Alkaline Phosphatase: 110 U/L (ref 39–117)
BILIRUBIN TOTAL: 0.3 mg/dL (ref 0.3–1.2)
BUN: 15 mg/dL (ref 6–23)
CALCIUM: 9.5 mg/dL (ref 8.4–10.5)
CHLORIDE: 103 meq/L (ref 96–112)
CO2: 26 mEq/L (ref 19–32)
CREATININE: 0.73 mg/dL (ref 0.50–1.35)
GFR calc Af Amer: >90 mL/min (ref >90)
GFR calc non Af Amer: >90 mL/min (ref >90)
Glucose, Bld: 140 mg/dL — ABNORMAL HIGH (ref 70–99)
Potassium: 4.2 mEq/L (ref 3.7–5.3)
Sodium: 141 mEq/L (ref 137–147)
TOTAL PROTEIN: 7 g/dL (ref 6.0–8.3)

## 2014-01-17 MED ORDER — OXYCODONE-ACETAMINOPHEN 5-325 MG PO TABS
2.0000 | ORAL_TABLET | Freq: Once | ORAL | Status: AC
  Administered 2014-01-17: 2 via ORAL
  Filled 2014-01-17: qty 2

## 2014-01-17 MED ORDER — OXYCODONE-ACETAMINOPHEN 5-325 MG PO TABS: 1.0000 | ORAL_TABLET | ORAL | Status: DC | PRN
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
## 2014-01-17 NOTE — ED Provider Notes (Signed)
CSN: [id number]     Arrival date & time 01/17/14  1133 History   First MD Initiated Contact with Patient 01/17/14 1544     Chief Complaint  Patient presents with  . Abdominal Pain     (Consider location/radiation/quality/duration/timing/severity/associated sxs/prior Treatment) Patient is a 58 y.o. male presenting with abdominal pain. The history is provided by the patient. No language interpreter was used.  Abdominal Pain Pain location:  Periumbilical Pain quality: aching and burning   Pain radiates to:  Does not radiate Pain severity:  Moderate Onset quality:  Gradual Duration:  6 weeks Timing:  Constant Progression:  Waxing and waning Chronicity:  New Context: previous surgery   Context: not recent illness and not trauma   Context comment:  Lifting heavy object Relieved by: percocet. Exacerbated by: standing, lifting. Associated symptoms: dysuria, nausea and vomiting   Associated symptoms: no chest pain, no diarrhea, no fever, no hematemesis, no hematochezia, no melena and no shortness of breath   Dysuria:    Severity:  Mild   Chronicity:  New Vomiting:    Quality:  Stomach contents   Number of occurrences:  1/ day   Duration:  1 week Risk factors: multiple surgeries   Risk factors: no NSAID use and no recent hospitalization     Past Medical History  Diagnosis Date  . Myocardial infarction     2008  . Polysubstance abuse   . Alcohol abuse   . CVA (cerebral infarction)   . HTN (hypertension)   . Hepatitis C   . COPD (chronic obstructive pulmonary disease)   . Ischemic cardiomyopathy   . Schizoaffective disorder   . Bipolar disorder   . TBI (traumatic brain injury)   . GI bleed    Past Surgical History  Procedure Laterality Date  . Splenectomy    . Coronary angioplasty with stent placement    . Esophagogastroduodenoscopy N/A 04/26/2013    Procedure: ESOPHAGOGASTRODUODENOSCOPY (EGD);  Surgeon: Graylin Shiver, MD;  Location: [facility] ENDOSCOPY;  Service: Endoscopy;   Laterality: N/A;  . Laparotomy N/A 04/26/2013    Procedure: EXPLORATORY LAPAROTOMY, OVERSEW OF GASTRIC AV MALFORMATION, REPAIR OF GASTROTOMY;  Surgeon: Cherylynn Ridges, MD;  Location: [facility] OR;  Service: General;  Laterality: N/A;  . Craniotomy     History reviewed. No pertinent family history. History  Substance Use Topics  . Smoking status: Former Smoker -- 1.00 packs/day for 40 years    Types: Cigarettes    Quit date: 05/01/2013  . Smokeless tobacco: Not on file  . Alcohol Use: No    Review of Systems  Constitutional: Positive for appetite change. Negative for fever.  Respiratory: Negative for chest tightness and shortness of breath.   Cardiovascular: Negative for chest pain.  Gastrointestinal: Positive for nausea, vomiting and abdominal pain. Negative for diarrhea, blood in stool, melena, hematochezia and hematemesis.  Genitourinary: Positive for dysuria. Negative for flank pain, penile swelling, scrotal swelling and penile pain.  Musculoskeletal: Negative for back pain.  Hematological: Does not bruise/bleed easily.  All other systems reviewed and are negative.     Allergies  Clonidine derivatives; Nitroglycerin; Toradol; Ultram; and Aspirin  Home Medications   Prior to Admission medications   Medication Sig Start Date End Date Taking? Authorizing Provider  ALPRAZolam Prudy Feeler) 1 MG tablet Take 1 tablet (1 mg total) by mouth 3 (three) times daily as needed for anxiety. 08/26/13  Yes Gerhard Munch, MD  aspirin 81 MG tablet Take 81 mg by mouth daily.   Yes Historical  Provider, MD  clopidogrel (PLAVIX) 75 MG tablet Take 75 mg by mouth daily with breakfast.    Historical Provider, MD  HYDROcodone-acetaminophen (NORCO/VICODIN) 5-325 MG per tablet Take 1 tablet by mouth every 8 (eight) hours as needed for moderate pain or severe pain. Patient not taking: Reported on 01/17/2014 08/26/13   Gerhard Munchobert Lockwood, MD   BP 201/108 mmHg  Pulse 80  Temp(Src) 98.3 F (36.8 C) (Oral)  Resp 18  Ht  5\' 9"  (1.753 m)  Wt 149 lb (67.586 kg)  BMI 21.99 kg/m2  SpO2 100% Physical Exam  Constitutional: He is oriented to person, place, and time. He appears well-developed and well-nourished. No distress.  HENT:  Mouth/Throat: Oropharynx is clear and moist.  Eyes: Conjunctivae are normal.  Cardiovascular: Normal rate, regular rhythm and normal heart sounds.   Pulmonary/Chest: Effort normal and breath sounds normal.  Abdominal: Soft. Bowel sounds are normal. There is tenderness in the periumbilical area. There is no rigidity and no rebound. A hernia is present. Hernia confirmed positive in the ventral area. Hernia confirmed negative in the right inguinal area and confirmed negative in the left inguinal area.    Genitourinary: Testes normal and penis normal.  Neurological: He is alert and oriented to person, place, and time.  Skin: Skin is warm.  Vitals reviewed.   ED Course  Procedures (including critical care time) Labs Review Labs Reviewed  COMPREHENSIVE METABOLIC PANEL - Abnormal; Notable for the following:    Glucose, Bld 140 (*)    All other components within normal limits  CBC WITH DIFFERENTIAL - Abnormal; Notable for the following:    WBC 13.8 (*)    RBC 3.86 (*)    Hemoglobin 12.4 (*)    HCT 37.2 (*)    Neutro Abs 8.1 (*)    Monocytes Absolute 1.5 (*)    All other components within normal limits    Imaging Review No results found.   EKG Interpretation None      MDM   Final diagnoses:  Ventral hernia without obstruction or gangrene    58 y/o male with h/o ex lap for GI bleed with ventral hernia and pain. Hernia appeared after lifting 500 lb object 2 mo ago. No fever, vomiting. Normal BM for him without blood or melena. Abdomen soft. Hernia easily reduced with large abdominal wall defect. Point tender over hernia. Plan for pain control and outpatient surgical management.     Abagail KitchensMegan Jezebelle Ledwell, MD 01/17/14 2325  Nelia Shiobert L Beaton, MD 01/18/14 [phone]

## 2014-01-17 NOTE — Discharge Instructions (Signed)

## 2014-01-27 ENCOUNTER — Emergency Department (HOSPITAL_COMMUNITY)
Admission: EM | Admit: 2014-01-27 | Discharge: 2014-01-28 | Disposition: A | Discharge status: Home / Self Care | Payer: Medicare Other | Attending: Emergency Medicine | Admitting: Emergency Medicine

## 2014-01-27 ENCOUNTER — Encounter (HOSPITAL_COMMUNITY): Payer: Self-pay | Admitting: *Deleted

## 2014-01-27 DIAGNOSIS — Z79899 Other long term (current) drug therapy: Secondary | ICD-10-CM | POA: Insufficient documentation

## 2014-01-27 DIAGNOSIS — Z7982 Long term (current) use of aspirin: Secondary | ICD-10-CM | POA: Diagnosis not present

## 2014-01-27 DIAGNOSIS — I10 Essential (primary) hypertension: Secondary | ICD-10-CM | POA: Diagnosis not present

## 2014-01-27 DIAGNOSIS — K439 Ventral hernia without obstruction or gangrene: Secondary | ICD-10-CM | POA: Insufficient documentation

## 2014-01-27 DIAGNOSIS — Z8619 Personal history of other infectious and parasitic diseases: Secondary | ICD-10-CM | POA: Insufficient documentation

## 2014-01-27 DIAGNOSIS — Z87828 Personal history of other (healed) physical injury and trauma: Secondary | ICD-10-CM | POA: Insufficient documentation

## 2014-01-27 DIAGNOSIS — Z8659 Personal history of other mental and behavioral disorders: Secondary | ICD-10-CM | POA: Diagnosis not present

## 2014-01-27 DIAGNOSIS — R109 Unspecified abdominal pain: Secondary | ICD-10-CM | POA: Diagnosis present

## 2014-01-27 DIAGNOSIS — I252 Old myocardial infarction: Secondary | ICD-10-CM | POA: Insufficient documentation

## 2014-01-27 DIAGNOSIS — Z8673 Personal history of transient ischemic attack (TIA), and cerebral infarction without residual deficits: Secondary | ICD-10-CM | POA: Insufficient documentation

## 2014-01-27 DIAGNOSIS — Z87891 Personal history of nicotine dependence: Secondary | ICD-10-CM | POA: Insufficient documentation

## 2014-01-27 DIAGNOSIS — J449 Chronic obstructive pulmonary disease, unspecified: Secondary | ICD-10-CM | POA: Insufficient documentation

## 2014-01-27 NOTE — ED Notes (Signed)
Pt c/o abdominal pain with hernia for two months. Pt is out of pain medications. Pt states a nurse in Dr. Dixon Boos office instructed pt to come to ED to be admitted and have a surgeon evaluate his hernia. Pt is scheduled for surgery January 9th. Pt states pain has been intolerable.

## 2014-01-28 DIAGNOSIS — K439 Ventral hernia without obstruction or gangrene: Secondary | ICD-10-CM | POA: Diagnosis not present

## 2014-01-28 MED ORDER — OXYCODONE-ACETAMINOPHEN 5-325 MG PO TABS
2.0000 | ORAL_TABLET | Freq: Once | ORAL | Status: AC
  Administered 2014-01-28: 2 via ORAL
  Filled 2014-01-28: qty 2

## 2014-01-28 NOTE — ED Notes (Signed)
abd binder placed on patient

## 2014-01-28 NOTE — Discharge Instructions (Signed)
Please follow up with the surgery clinic for further workup and management of your pain.  Wear the binder for comfort.  Please follow up with a doctor in your home town to establish care and possible referral for pain clinic   Hernia A hernia occurs when an internal organ pushes out through a weak spot in the abdominal wall. Hernias most commonly occur in the groin and around the navel. Hernias often can be pushed back into place (reduced). Most hernias tend to get worse over time. Some abdominal hernias can get stuck in the opening (irreducible or incarcerated hernia) and cannot be reduced. An irreducible abdominal hernia which is tightly squeezed into the opening is at risk for impaired blood supply (strangulated hernia). A strangulated hernia is a medical emergency. Because of the risk for an irreducible or strangulated hernia, surgery may be recommended to repair a hernia. CAUSES   Heavy lifting.  Prolonged coughing.  Straining to have a bowel movement.  A cut (incision) made during an abdominal surgery. HOME CARE INSTRUCTIONS   Bed rest is not required. You may continue your normal activities.  Avoid lifting more than 10 pounds (4.5 kg) or straining.  Cough gently. If you are a smoker it is best to stop. Even the best hernia repair can break down with the continual strain of coughing. Even if you do not have your hernia repaired, a cough will continue to aggravate the problem.  Do not wear anything tight over your hernia. Do not try to keep it in with an outside bandage or truss. These can damage abdominal contents if they are trapped within the hernia sac.  Eat a normal diet.  Avoid constipation. Straining over long periods of time will increase hernia size and encourage breakdown of repairs. If you cannot do this with diet alone, stool softeners may be used. SEEK IMMEDIATE MEDICAL CARE IF:   You have a fever.  You develop increasing abdominal pain.  You feel nauseous or  vomit.  Your hernia is stuck outside the abdomen, looks discolored, feels hard, or is tender.  You have any changes in your bowel habits or in the hernia that are unusual for you.  You have increased pain or swelling around the hernia.  You cannot push the hernia back in place by applying gentle pressure while lying down. MAKE SURE YOU:   Understand these instructions.  Will watch your condition.  Will get help right away if you are not doing well or get worse. Document Released: 02/28/2005 Document Revised: 05/23/2011 Document Reviewed: 10/18/2007 [facility] Patient Information 2015 [employer] [place]. This information is not intended to replace advice given to you by your health care provider. Make sure you discuss any questions you have with your health care provider.  Ventral Hernia A ventral hernia (also called an incisional hernia) is a hernia that occurs at the site of a previous surgical cut (incision) in the abdomen. The abdominal wall spans from your lower chest down to your pelvis. If the abdominal wall is weakened from a surgical incision, a hernia can occur. A hernia is a bulge of bowel or muscle tissue pushing out on the weakened part of the abdominal wall. Ventral hernias can get bigger from straining or lifting. Obese and older people are at higher risk for a ventral hernia. People who develop infections after surgery or require repeat incisions at the same site on the abdomen are also at increased risk. CAUSES  A ventral hernia occurs because of weakness in the  abdominal wall at an incision site.  SYMPTOMS  Common symptoms include:  A visible bulge or lump on the abdominal wall.  Pain or tenderness around the lump.  Increased discomfort if you cough or make a sudden movement. If the hernia has blocked part of the intestine, a serious complication can occur (incarcerated or strangulated hernia). This can become a problem that requires emergency surgery because the blood  flow to the blocked intestine may be cut off. Symptoms may include:  Feeling sick to your stomach (nauseous).  Throwing up (vomiting).  Stomach swelling (distention) or bloating.  Fever.  Rapid heartbeat. DIAGNOSIS  Your health care provider will take a medical history and perform a physical exam. Various tests may be ordered, such as:  Blood tests.  Urine tests.  Ultrasonography.  X-rays.  Computed tomography (CT). TREATMENT  Watchful waiting may be all that is needed for a smaller hernia that does not cause symptoms. Your health care provider may recommend the use of a supportive belt (truss) that helps to keep the abdominal wall intact. For larger hernias or those that cause pain, surgery to repair the hernia is usually recommended. If a hernia becomes strangulated, emergency surgery needs to be done right away. HOME CARE INSTRUCTIONS  Avoid putting pressure or strain on the abdominal area.  Avoid heavy lifting.  Use good body positioning for physical tasks. Ask your health care provider about proper body positioning.  Use a supportive belt as directed by your health care provider.  Maintain a healthy weight.  Eat foods that are high in fiber, such as whole grains, fruits, and vegetables. Fiber helps prevent difficult bowel movements (constipation).  Drink enough fluids to keep your urine clear or pale yellow.  Follow up with your health care provider as directed. SEEK MEDICAL CARE IF:   Your hernia seems to be getting larger or more painful. SEEK IMMEDIATE MEDICAL CARE IF:   You have abdominal pain that is sudden and sharp.  Your pain becomes severe.  You have repeated vomiting.  You are sweating a lot.  You notice a rapid heartbeat.  You develop a fever. MAKE SURE YOU:   Understand these instructions.  Will watch your condition.  Will get help right away if you are not doing well or get worse. Document Released: 02/15/2012 Document Revised:  07/15/2013 Document Reviewed: 02/15/2012 [facility] Patient Information 2015 [place], [place]. This information is not intended to replace advice given to you by your health care provider. Make sure you discuss any questions you have with your health care provider.
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
## 2014-01-31 ENCOUNTER — Other Ambulatory Visit (INDEPENDENT_AMBULATORY_CARE_PROVIDER_SITE_OTHER): Payer: Self-pay

## 2014-01-31 DIAGNOSIS — K432 Incisional hernia without obstruction or gangrene: Secondary | ICD-10-CM

## 2014-01-31 DIAGNOSIS — R1084 Generalized abdominal pain: Secondary | ICD-10-CM

## 2014-02-05 ENCOUNTER — Inpatient Hospital Stay: Admission: RE | Admit: 2014-02-05 | Payer: Self-pay | Source: Ambulatory Visit

## 2014-03-16 ENCOUNTER — Emergency Department (HOSPITAL_COMMUNITY)
Admission: EM | Admit: 2014-03-16 | Discharge: 2014-03-16 | Disposition: A | Discharge status: Home / Self Care | Payer: Medicare Other | Source: Home / Self Care | Attending: Emergency Medicine | Admitting: Emergency Medicine

## 2014-03-16 ENCOUNTER — Emergency Department (HOSPITAL_COMMUNITY)
Admission: EM | Admit: 2014-03-16 | Discharge: 2014-03-16 | Disposition: A | Discharge status: Home / Self Care | Payer: Medicare Other | Attending: Emergency Medicine | Admitting: Emergency Medicine

## 2014-03-16 ENCOUNTER — Encounter (HOSPITAL_COMMUNITY): Payer: Self-pay | Admitting: Emergency Medicine

## 2014-03-16 DIAGNOSIS — F319 Bipolar disorder, unspecified: Secondary | ICD-10-CM | POA: Insufficient documentation

## 2014-03-16 DIAGNOSIS — Z8679 Personal history of other diseases of the circulatory system: Secondary | ICD-10-CM

## 2014-03-16 DIAGNOSIS — I10 Essential (primary) hypertension: Secondary | ICD-10-CM | POA: Insufficient documentation

## 2014-03-16 DIAGNOSIS — Z79899 Other long term (current) drug therapy: Secondary | ICD-10-CM | POA: Diagnosis not present

## 2014-03-16 DIAGNOSIS — Z8673 Personal history of transient ischemic attack (TIA), and cerebral infarction without residual deficits: Secondary | ICD-10-CM

## 2014-03-16 DIAGNOSIS — Z87891 Personal history of nicotine dependence: Secondary | ICD-10-CM

## 2014-03-16 DIAGNOSIS — Z87820 Personal history of traumatic brain injury: Secondary | ICD-10-CM | POA: Insufficient documentation

## 2014-03-16 DIAGNOSIS — I252 Old myocardial infarction: Secondary | ICD-10-CM | POA: Insufficient documentation

## 2014-03-16 DIAGNOSIS — Z8619 Personal history of other infectious and parasitic diseases: Secondary | ICD-10-CM | POA: Insufficient documentation

## 2014-03-16 DIAGNOSIS — Z765 Malingerer [conscious simulation]: Secondary | ICD-10-CM

## 2014-03-16 DIAGNOSIS — K439 Ventral hernia without obstruction or gangrene: Secondary | ICD-10-CM

## 2014-03-16 DIAGNOSIS — Z7982 Long term (current) use of aspirin: Secondary | ICD-10-CM | POA: Insufficient documentation

## 2014-03-16 DIAGNOSIS — Z7289 Other problems related to lifestyle: Secondary | ICD-10-CM | POA: Insufficient documentation

## 2014-03-16 DIAGNOSIS — J449 Chronic obstructive pulmonary disease, unspecified: Secondary | ICD-10-CM

## 2014-03-16 DIAGNOSIS — Z9081 Acquired absence of spleen: Secondary | ICD-10-CM

## 2014-03-16 DIAGNOSIS — L905 Scar conditions and fibrosis of skin: Secondary | ICD-10-CM | POA: Insufficient documentation

## 2014-03-16 DIAGNOSIS — M6289 Other specified disorders of muscle: Secondary | ICD-10-CM

## 2014-03-16 DIAGNOSIS — K469 Unspecified abdominal hernia without obstruction or gangrene: Secondary | ICD-10-CM | POA: Diagnosis present

## 2014-03-16 LAB — CBC WITH DIFFERENTIAL/PLATELET
Basophils Absolute: 0.1 10*3/uL (ref 0.0–0.1)
Basophils Relative: 1 % (ref 0–1)
Eosinophils Absolute: 0.1 10*3/uL (ref 0.0–0.7)
Eosinophils Relative: 1 % (ref 0–5)
HCT: 36.4 % — ABNORMAL LOW (ref 39.0–52.0)
HEMOGLOBIN: 12.8 g/dL — AB (ref 13.0–17.0)
LYMPHS ABS: 5.6 10*3/uL — AB (ref 0.7–4.0)
LYMPHS PCT: 39 % (ref 12–46)
MCH: 32.6 pg (ref 26.0–34.0)
MCHC: 35.2 g/dL (ref 30.0–36.0)
MCV: 92.6 fL (ref 78.0–100.0)
Monocytes Absolute: 1 10*3/uL (ref 0.1–1.0)
Monocytes Relative: 7 % (ref 3–12)
NEUTROS ABS: 7.6 10*3/uL (ref 1.7–7.7)
Neutrophils Relative %: 52 % (ref 43–77)
PLATELETS: 364 10*3/uL (ref 150–400)
RBC: 3.93 MIL/uL — ABNORMAL LOW (ref 4.22–5.81)
RDW: 13.7 % (ref 11.5–15.5)
WBC: 14.4 10*3/uL — AB (ref 4.0–10.5)

## 2014-03-16 LAB — COMPREHENSIVE METABOLIC PANEL
ALT: 27 U/L (ref 0–53)
AST: 35 U/L (ref 0–37)
Albumin: 3.9 g/dL (ref 3.5–5.2)
Alkaline Phosphatase: 86 U/L (ref 39–117)
Anion gap: 10 (ref 5–15)
BILIRUBIN TOTAL: 0.8 mg/dL (ref 0.3–1.2)
BUN: 10 mg/dL (ref 6–23)
CHLORIDE: 101 meq/L (ref 96–112)
CO2: 21 mmol/L (ref 19–32)
Calcium: 9.1 mg/dL (ref 8.4–10.5)
Creatinine, Ser: 0.99 mg/dL (ref 0.50–1.35)
GFR calc non Af Amer: 88 mL/min — ABNORMAL LOW (ref >90)
GLUCOSE: 124 mg/dL — AB (ref 70–99)
POTASSIUM: 3.8 mmol/L (ref 3.5–5.1)
Sodium: 132 mmol/L — ABNORMAL LOW (ref 135–145)
Total Protein: 7.1 g/dL (ref 6.0–8.3)

## 2014-03-16 LAB — TROPONIN I: Troponin I: <0.03 ng/mL (ref <0.031)

## 2014-03-16 LAB — LIPASE, BLOOD: LIPASE: 47 U/L (ref 11–59)

## 2014-03-16 MED ORDER — OXYCODONE-ACETAMINOPHEN 5-325 MG PO TABS: 1.0000 | ORAL_TABLET | ORAL | Status: AC | PRN

## 2014-03-16 MED ORDER — ALPRAZOLAM 0.25 MG PO TABS
1.0000 mg | ORAL_TABLET | Freq: Once | ORAL | Status: AC
  Administered 2014-03-16: 1 mg via ORAL
  Filled 2014-03-16: qty 4

## 2014-03-16 MED ORDER — ALPRAZOLAM 1 MG PO TABS: 1.0000 mg | ORAL_TABLET | Freq: Three times a day (TID) | ORAL | Status: AC | PRN

## 2014-03-16 MED ORDER — ONDANSETRON HCL 4 MG/2ML IJ SOLN
4.0000 mg | Freq: Once | INTRAMUSCULAR | Status: AC
  Administered 2014-03-16: 4 mg via INTRAMUSCULAR
  Filled 2014-03-16: qty 2

## 2014-03-16 MED ORDER — OXYCODONE-ACETAMINOPHEN 5-325 MG PO TABS: 1.0000 | ORAL_TABLET | ORAL | Status: DC | PRN

## 2014-03-16 MED ORDER — CLONIDINE HCL 0.1 MG PO TABS: 0.1000 mg | ORAL_TABLET | Freq: Two times a day (BID) | ORAL | Status: AC

## 2014-03-16 MED ORDER — OXYCODONE-ACETAMINOPHEN 5-325 MG PO TABS: 1.0000 | ORAL_TABLET | Freq: Once | ORAL | Status: AC

## 2014-03-16 MED ORDER — FENTANYL CITRATE 0.05 MG/ML IJ SOLN
50.0000 ug | Freq: Once | INTRAMUSCULAR | Status: AC
  Administered 2014-03-16: 50 ug via INTRAVENOUS
  Filled 2014-03-16: qty 2

## 2014-03-16 MED ORDER — HYDROMORPHONE HCL 1 MG/ML IJ SOLN
1.0000 mg | Freq: Once | INTRAMUSCULAR | Status: AC
  Administered 2014-03-16: 1 mg via INTRAVENOUS
  Filled 2014-03-16: qty 1

## 2014-03-16 MED ORDER — OXYCODONE-ACETAMINOPHEN 5-325 MG PO TABS
1.0000 | ORAL_TABLET | Freq: Once | ORAL | Status: AC
  Administered 2014-03-16: 1 via ORAL
  Filled 2014-03-16: qty 1

## 2014-03-16 NOTE — ED Notes (Signed)
Pt came to triage asking for food and $15 to get RX filled at [facility]. [place] sandwich given with soda. Social worker # called and message left for possible assistance with rx. Pt states he is sleeping in the waiting room and will get picked up by daughter at 5pm tomorrow.  Pt then stated he is in severe pain, and may have to get checked back in.

## 2014-03-16 NOTE — ED Notes (Signed)
Pt. Left with all belongings and refused wheelchair 

## 2014-03-16 NOTE — ED Notes (Addendum)
Pt states he has a blockage in his hernia and is here to get evaluated for surgery. States he has been living in a motel and left his "papers"? There. Pt states he was told to come here to be admitted for surgery.

## 2014-03-16 NOTE — ED Provider Notes (Addendum)
CSN: [id number]     Arrival date & time 03/16/14  2244 History   First MD Initiated Contact with Patient 03/16/14 2331    This chart was scribed for Tyrone Karczewski [name], MD by Marica Otter, ED Scribe. This patient was seen in room A06C/A06C and the patient's care was started at 11:38 PM.  Chief Complaint  Patient presents with  . Hernia   The history is provided by the patient and medical records.   PCP: Pcp Not In System HPI Comments: Tyrone [name] is a 59 y.o. male, with medical Hx noted below, who presents to the Emergency Department complaining of continuing atraumatic, acute, generalized, aching, constant, abd pain radiating to the back. Pt was seen at the ED for the same at 5:50PM today and was recently discharged with RX for percocet and clonidine and 2 RX for xanax. Pt states that he returned to the ED because he was unable to get his pain meds filled due to price as insurance will not pay for an additional percocet 5/325; then reports that he lost his Rx provided during his last visit to the ED. Pt does not have any new complaints or worsening Sx.   Past Medical History  Diagnosis Date  . Myocardial infarction     2008  . Polysubstance abuse   . Alcohol abuse   . CVA (cerebral infarction)   . HTN (hypertension)   . Hepatitis C   . COPD (chronic obstructive pulmonary disease)   . Ischemic cardiomyopathy   . Schizoaffective disorder   . Bipolar disorder   . TBI (traumatic brain injury)   . GI bleed    Past Surgical History  Procedure Laterality Date  . Splenectomy    . Coronary angioplasty with stent placement    . Esophagogastroduodenoscopy N/A 04/26/2013    Procedure: ESOPHAGOGASTRODUODENOSCOPY (EGD);  Surgeon: Graylin Shiver, MD;  Location: [facility] ENDOSCOPY;  Service: Endoscopy;  Laterality: N/A;  . Laparotomy N/A 04/26/2013    Procedure: EXPLORATORY LAPAROTOMY, OVERSEW OF GASTRIC AV MALFORMATION, REPAIR OF GASTROTOMY;  Surgeon: Cherylynn Ridges, MD;  Location: [facility] OR;  Service:  General;  Laterality: N/A;  . Craniotomy     No family history on file. History  Substance Use Topics  . Smoking status: Former Smoker -- 1.00 packs/day for 40 years    Types: Cigarettes    Quit date: 05/01/2013  . Smokeless tobacco: Not on file  . Alcohol Use: No    Review of Systems  Constitutional: Negative for fever and chills.  Gastrointestinal: Positive for abdominal pain.  Psychiatric/Behavioral: Negative for confusion.  All other systems reviewed and are negative.  Allergies  Clonidine derivatives; Nitroglycerin; Toradol; Ultram; and Aspirin  Home Medications   Prior to Admission medications   Medication Sig Start Date End Date Taking? Authorizing Provider  ALPRAZolam Prudy Feeler) 1 MG tablet Take 1 tablet (1 mg total) by mouth 3 (three) times daily as needed for anxiety. 03/16/14   Elson Areas, PA-C  ALPRAZolam Prudy Feeler) 1 MG tablet Take 1 tablet (1 mg total) by mouth 3 (three) times daily as needed for anxiety. 03/16/14   Elson Areas, PA-C  aspirin EC 81 MG tablet Take 81 mg by mouth daily.    Historical Provider, MD  cloNIDine (CATAPRES) 0.1 MG tablet Take 1 tablet (0.1 mg total) by mouth 2 (two) times daily. 03/16/14   Elson Areas, PA-C  oxyCODONE-acetaminophen (PERCOCET/ROXICET) 5-325 MG per tablet Take 1-2 tablets by mouth every 4 (four) hours as needed  for severe pain. 03/16/14   Elson Areas, PA-C  zolpidem (AMBIEN) 10 MG tablet Take 10 mg by mouth at bedtime as needed for sleep.  02/05/14   Historical Provider, MD   Triage Vitals: BP 151/99 mmHg  Pulse 98  Temp(Src) 98.3 F (36.8 C) (Oral)  Resp 16  SpO2 98% Physical Exam  Constitutional: He is oriented to person, place, and time. He appears well-developed and well-nourished. No distress.  HENT:  Head: Normocephalic and atraumatic.  Mouth/Throat: Oropharynx is clear and moist.  Eyes: Pupils are equal, round, and reactive to light.  Neck: Normal range of motion. Neck supple.  Cardiovascular: Normal rate,  regular rhythm and normal heart sounds.   No murmur heard. Pulmonary/Chest: Effort normal and breath sounds normal. No respiratory distress.  Large ventricular defect 4 inches long and 3 inches in diameter.   Abdominal: Soft. Bowel sounds are normal. He exhibits no distension. Mass: large ventral hernia at incision line. Reduces on its own.  There is no tenderness. There is no rebound and no guarding.  Old appendectomy scar noted   Musculoskeletal: Normal range of motion. He exhibits no edema.  Neurological: He is alert and oriented to person, place, and time. He has normal reflexes.  Skin: Skin is warm and dry.  Psychiatric: He has a normal mood and affect.  Nursing note and vitals reviewed.   ED Course  Procedures (including critical care time) DIAGNOSTIC STUDIES: Oxygen Saturation is 98% on RA, nl by my interpretation.    COORDINATION OF CARE: 11:42 PM-Discussed treatment plan which includes meds with pt at bedside and pt agreed to plan.   Labs Review Labs Reviewed - No data to display  Imaging Review No results found.   EKG Interpretation None      MDM   Final diagnoses:  None    EDP to see the patient with Langston Masker previous PA.  Patient now wants oxy 10 mg/325 mg as there is problem with his insurance and he can't afford medication.  If we will not refill medication he would like to be admitted for the surgery.  EDP and Clydie Braun explained that rewriting RX that was already filled is both unethical and constitutes narcotic seeking behavior.  We will not be writing for any further narcotics for this patient.  Patient now states he has no ride home though he just went to [facility] on [address].  Hernia self reduced.  Clearly narcotic seeking behavior.  Also seeing [facility] and [place].  Patient is stable for discharge to home.  No additional narcotic rx.    I personally performed the services described in this documentation, which was scribed in my presence. The  recorded information has been reviewed and is accurate.    Jasmine Awe, MD 03/17/14 0009  Valbona Slabach K Majestic Molony-Rasch, MD 03/17/14 [phone]

## 2014-03-16 NOTE — ED Notes (Signed)
Pain medicine given, security escorted out pt. Pt left with all belongings and refused wheelchair.

## 2014-03-16 NOTE — ED Notes (Signed)
Pt sts here to have hernia evaluated for surgery in umbilical area

## 2014-03-16 NOTE — ED Notes (Signed)
Pt states he is very anxious and stressed because his mother died last week "and she was all i had left" pt has extensive mental health hx.

## 2014-03-16 NOTE — ED Notes (Signed)
Pt asked to speak with chaplain, Chaplain notified, will try to see him tonight, and will report off in the morning to oncoming chaplain also.

## 2014-03-16 NOTE — Discharge Instructions (Signed)

## 2014-03-16 NOTE — ED Notes (Signed)
Pt came to triage asking

## 2014-03-16 NOTE — ED Notes (Signed)
Pt curled up in ball crying out due to abd pain. Abd bowel sounds hyperactive at this time. Pt states this happens frequently, "it comes in spurts out of nowhere"

## 2014-03-16 NOTE — Discharge Instructions (Signed)
Follow previous instructions

## 2014-03-16 NOTE — ED Notes (Signed)
Pt. Was DC'd at 2030 this evening. Pt. Reports wanting pain medication.

## 2014-03-16 NOTE — ED Notes (Signed)
Pt complaining of left sided chest pain. ECG complete. Handed to MD and PA.

## 2014-03-16 NOTE — ED Notes (Signed)
Pt states he was just discharged and needs $15 to get pain medication filled.  States he is checking back in for more pain medication because he is having to wait in the waiting room all night.

## 2014-03-16 NOTE — ED Provider Notes (Signed)
CSN: [id number]     Arrival date & time 03/16/14  1344 History   First MD Initiated Contact with Patient 03/16/14 1750     Chief Complaint  Patient presents with  . Hernia     (Consider location/radiation/quality/duration/timing/severity/associated sxs/prior Treatment) Patient is a 59 y.o. male presenting with abdominal pain. The history is provided by the patient. No language interpreter was used.  Abdominal Pain Pain location:  Generalized Pain quality: aching and fullness   Pain radiates to:  Back Pain severity:  Severe Onset quality:  Gradual Timing:  Constant Progression:  Worsening Chronicity:  New Relieved by:  Nothing Worsened by:  Nothing tried Ineffective treatments:  None tried Associated symptoms: no vomiting    Pt presents requesting hernia repair.  Pt reports he is suppose to see Dr. Lindie Spruce with [facility] surgery but can not be seen for 2 weeks.   Pt reports hospital in [place] told him to come here.  Pt reports it took him days to get a ride here.  Pt requesting pain medication.  Pt has a rambling history that surrounds getting pain medication.  Past Medical History  Diagnosis Date  . Myocardial infarction     2008  . Polysubstance abuse   . Alcohol abuse   . CVA (cerebral infarction)   . HTN (hypertension)   . Hepatitis C   . COPD (chronic obstructive pulmonary disease)   . Ischemic cardiomyopathy   . Schizoaffective disorder   . Bipolar disorder   . TBI (traumatic brain injury)   . GI bleed    Past Surgical History  Procedure Laterality Date  . Splenectomy    . Coronary angioplasty with stent placement    . Esophagogastroduodenoscopy N/A 04/26/2013    Procedure: ESOPHAGOGASTRODUODENOSCOPY (EGD);  Surgeon: Graylin Shiver, MD;  Location: [facility] ENDOSCOPY;  Service: Endoscopy;  Laterality: N/A;  . Laparotomy N/A 04/26/2013    Procedure: EXPLORATORY LAPAROTOMY, OVERSEW OF GASTRIC AV MALFORMATION, REPAIR OF GASTROTOMY;  Surgeon: Cherylynn Ridges, MD;  Location:  [facility] OR;  Service: General;  Laterality: N/A;  . Craniotomy     History reviewed. No pertinent family history. History  Substance Use Topics  . Smoking status: Former Smoker -- 1.00 packs/day for 40 years    Types: Cigarettes    Quit date: 05/01/2013  . Smokeless tobacco: Not on file  . Alcohol Use: No    Review of Systems  Gastrointestinal: Positive for abdominal pain. Negative for vomiting.  All other systems reviewed and are negative.     Allergies  Clonidine derivatives; Nitroglycerin; Toradol; Ultram; and Aspirin  Home Medications   Prior to Admission medications   Medication Sig Start Date End Date Taking? Authorizing Provider  ALPRAZolam Prudy Feeler) 1 MG tablet Take 1 tablet (1 mg total) by mouth 3 (three) times daily as needed for anxiety. Patient taking differently: Take 1 mg by mouth 3 (three) times daily.  08/26/13  Yes Gerhard Munch, MD  aspirin EC 81 MG tablet Take 81 mg by mouth daily.   Yes Historical Provider, MD  cloNIDine (CATAPRES) 0.1 MG tablet Take 0.1 mg by mouth 2 (two) times daily.  03/03/14  Yes Historical Provider, MD  oxyCODONE-acetaminophen (PERCOCET) 10-325 MG per tablet Take 1 tablet by mouth every 6 (six) hours as needed for pain.   Yes Historical Provider, MD  zolpidem (AMBIEN) 10 MG tablet Take 10 mg by mouth at bedtime as needed for sleep.  02/05/14  Yes Historical Provider, MD  HYDROcodone-acetaminophen (NORCO/VICODIN) 5-325 MG per  tablet Take 1 tablet by mouth every 8 (eight) hours as needed for moderate pain or severe pain. Patient not taking: Reported on 01/17/2014 08/26/13   Gerhard Munch, MD  oxyCODONE-acetaminophen (PERCOCET/ROXICET) 5-325 MG per tablet Take 1-2 tablets by mouth every 4 (four) hours as needed for moderate pain or severe pain. Patient not taking: Reported on 03/16/2014 01/17/14   Abagail Kitchens, MD   BP 159/98 mmHg  Pulse 78  Temp(Src) 97.7 F (36.5 C) (Oral)  Resp 17  SpO2 99% Physical Exam  Constitutional: He is oriented to  person, place, and time. He appears well-developed and well-nourished.  HENT:  Head: Normocephalic and atraumatic.  Right Ear: External ear normal.  Eyes: Conjunctivae and EOM are normal. Pupils are equal, round, and reactive to light.  Neck: Normal range of motion.  Cardiovascular: Normal rate and normal heart sounds.   Pulmonary/Chest: Effort normal.  Abdominal: He exhibits mass. He exhibits no distension.  Large ventral hernia at incision line.  Reduces easily.    Musculoskeletal: Normal range of motion.  Neurological: He is alert and oriented to person, place, and time.  Psychiatric: He has a normal mood and affect.  Nursing note and vitals reviewed.   ED Course  Procedures (including critical care time) Labs Review Labs Reviewed  CBC WITH DIFFERENTIAL  COMPREHENSIVE METABOLIC PANEL  LIPASE, BLOOD    Imaging Review No results found.   EKG Interpretation   Date/Time:  Sunday March 16 2014 18:37:58 EST Ventricular Rate:  81 PR Interval:  177 QRS Duration: 86 QT Interval:  371 QTC Calculation: 431 R Axis:   -19 Text Interpretation:  Age not entered, assumed to be  59 years old for  purpose of ECG interpretation Sinus rhythm Borderline left axis deviation  similar previous Confirmed by ZAVITZ  MD, JOSHUA ([id number]) on 03/16/2014  6:45:17 PM      MDM   Final diagnoses:  Hernia of anterior abdominal wall    Xanax Percocet Clonidine     Elson Areas, PA-C 03/16/14 1959  Enid Skeens, MD 03/17/14 [phone]

## 2014-03-17 ENCOUNTER — Emergency Department (HOSPITAL_COMMUNITY)
Admission: EM | Admit: 2014-03-17 | Discharge: 2014-03-17 | Disposition: A | Discharge status: Home / Self Care | Payer: Medicare Other | Attending: Emergency Medicine | Admitting: Emergency Medicine

## 2014-03-17 ENCOUNTER — Encounter (HOSPITAL_COMMUNITY): Payer: Self-pay | Admitting: Cardiology

## 2014-03-17 ENCOUNTER — Encounter (HOSPITAL_COMMUNITY): Payer: Self-pay | Admitting: Emergency Medicine

## 2014-03-17 DIAGNOSIS — J449 Chronic obstructive pulmonary disease, unspecified: Secondary | ICD-10-CM | POA: Diagnosis not present

## 2014-03-17 DIAGNOSIS — Z8673 Personal history of transient ischemic attack (TIA), and cerebral infarction without residual deficits: Secondary | ICD-10-CM | POA: Diagnosis not present

## 2014-03-17 DIAGNOSIS — Z8619 Personal history of other infectious and parasitic diseases: Secondary | ICD-10-CM | POA: Insufficient documentation

## 2014-03-17 DIAGNOSIS — Z79899 Other long term (current) drug therapy: Secondary | ICD-10-CM | POA: Diagnosis not present

## 2014-03-17 DIAGNOSIS — Z8659 Personal history of other mental and behavioral disorders: Secondary | ICD-10-CM | POA: Diagnosis not present

## 2014-03-17 DIAGNOSIS — Z87891 Personal history of nicotine dependence: Secondary | ICD-10-CM | POA: Insufficient documentation

## 2014-03-17 DIAGNOSIS — K439 Ventral hernia without obstruction or gangrene: Secondary | ICD-10-CM | POA: Diagnosis not present

## 2014-03-17 DIAGNOSIS — I10 Essential (primary) hypertension: Secondary | ICD-10-CM | POA: Diagnosis not present

## 2014-03-17 DIAGNOSIS — R1084 Generalized abdominal pain: Secondary | ICD-10-CM | POA: Diagnosis present

## 2014-03-17 DIAGNOSIS — I252 Old myocardial infarction: Secondary | ICD-10-CM | POA: Diagnosis not present

## 2014-03-17 NOTE — ED Notes (Signed)
Pt reports that he his a umbilical hernia that needs surgery. Reports that he was sent here for possible admission.

## 2014-03-17 NOTE — Discharge Planning (Signed)
NCM and CSW spoke to pt in ER lobby.  Provided GoodRx card to purchase medication for $10 at [facility]; bus passes and called pt daughter to pick him up when she gets off work.  Pt satisfied with what was done for him; will pick up medications and "hang around" until daughter gets here.

## 2014-03-17 NOTE — ED Notes (Signed)
Case management aware pt requesting to speak with them about transportation back to [place]

## 2014-03-17 NOTE — Discharge Instructions (Signed)

## 2014-03-17 NOTE — ED Provider Notes (Signed)
CSN: [id number]     Arrival date & time 03/17/14  5436 History   First MD Initiated Contact with Patient 03/17/14 [phone]     Chief Complaint  Patient presents with  . Hernia     (Consider location/radiation/quality/duration/timing/severity/associated sxs/prior Treatment) Patient is a 59 y.o. male presenting with abdominal pain. The history is provided by the patient.  Abdominal Pain Pain location:  Generalized Pain quality: not aching and not bloating   Pain radiates to:  Does not radiate Pain severity:  Moderate Onset quality:  Gradual Timing:  Constant Progression:  Worsening Associated symptoms: no chills, no cough, no fever, no shortness of breath and no vomiting     Past Medical History  Diagnosis Date  . Myocardial infarction     2008  . Polysubstance abuse   . Alcohol abuse   . CVA (cerebral infarction)   . HTN (hypertension)   . Hepatitis C   . COPD (chronic obstructive pulmonary disease)   . Ischemic cardiomyopathy   . Schizoaffective disorder   . Bipolar disorder   . TBI (traumatic brain injury)   . GI bleed    Past Surgical History  Procedure Laterality Date  . Splenectomy    . Coronary angioplasty with stent placement    . Esophagogastroduodenoscopy N/A 04/26/2013    Procedure: ESOPHAGOGASTRODUODENOSCOPY (EGD);  Surgeon: Graylin Shiver, MD;  Location: [facility] ENDOSCOPY;  Service: Endoscopy;  Laterality: N/A;  . Laparotomy N/A 04/26/2013    Procedure: EXPLORATORY LAPAROTOMY, OVERSEW OF GASTRIC AV MALFORMATION, REPAIR OF GASTROTOMY;  Surgeon: Cherylynn Ridges, MD;  Location: [facility] OR;  Service: General;  Laterality: N/A;  . Craniotomy     History reviewed. No pertinent family history. History  Substance Use Topics  . Smoking status: Former Smoker -- 1.00 packs/day for 40 years    Types: Cigarettes    Quit date: 05/01/2013  . Smokeless tobacco: Not on file  . Alcohol Use: No    Review of Systems  Constitutional: Negative for fever and chills.  Respiratory: Negative  for cough and shortness of breath.   Gastrointestinal: Negative for vomiting and abdominal pain.  All other systems reviewed and are negative.     Allergies  Clonidine derivatives; Nitroglycerin; Toradol; Ultram; and Aspirin  Home Medications   Prior to Admission medications   Medication Sig Start Date End Date Taking? Authorizing Provider  ALPRAZolam Prudy Feeler) 1 MG tablet Take 1 tablet (1 mg total) by mouth 3 (three) times daily as needed for anxiety. 03/16/14  Yes Elson Areas, PA-C  cloNIDine (CATAPRES) 0.1 MG tablet Take 1 tablet (0.1 mg total) by mouth 2 (two) times daily. 03/16/14  Yes Elson Areas, PA-C  oxyCODONE-acetaminophen (PERCOCET/ROXICET) 5-325 MG per tablet Take 1-2 tablets by mouth every 4 (four) hours as needed for severe pain. 03/16/14  Yes Lonia Skinner Sofia, PA-C  zolpidem (AMBIEN) 10 MG tablet Take 10 mg by mouth at bedtime as needed for sleep.  02/05/14  Yes Historical Provider, MD  ALPRAZolam Prudy Feeler) 1 MG tablet Take 1 tablet (1 mg total) by mouth 3 (three) times daily as needed for anxiety. Patient not taking: Reported on 03/17/2014 03/16/14   Elson Areas, PA-C   BP 142/92 mmHg  Pulse 80  Temp(Src) 98.3 F (36.8 C) (Oral)  Resp 18  Ht 5\' 6"  (1.676 m)  Wt 150 lb (68.04 kg)  BMI 24.22 kg/m2  SpO2 98% Physical Exam  Constitutional: He is oriented to person, place, and time. He appears well-developed and well-nourished.  No distress.  HENT:  Head: Normocephalic and atraumatic.  Mouth/Throat: No oropharyngeal exudate.  Eyes: EOM are normal. Pupils are equal, round, and reactive to light.  Neck: Normal range of motion. Neck supple.  Cardiovascular: Normal rate and regular rhythm.  Exam reveals no friction rub.   No murmur heard. Pulmonary/Chest: Effort normal and breath sounds normal. No respiratory distress. He has no wheezes. He has no rales.  Abdominal: He exhibits no distension. There is tenderness (mild, over hernia. Easily reducible ventral hernia). There is no  rebound.  Musculoskeletal: Normal range of motion. He exhibits no edema.  Neurological: He is alert and oriented to person, place, and time.  Skin: He is not diaphoretic.  Nursing note and vitals reviewed.   ED Course  Procedures (including critical care time) Labs Review Labs Reviewed - No data to display  Imaging Review No results found.   EKG Interpretation None      MDM   Final diagnoses:  Abdominal wall hernia    9M here wanting to get surgery for his abdominal wall hernia. Has been seen twice in the ED over past 24 hours and there was some concern for narcotic seeking behavior. Patient here with easily reducible ventral hernia without major tenderness or peritoneal signs. Instructed to f/u with [facility].    Elwin Mocha, MD 03/17/14 [phone]

## 2014-03-17 NOTE — Progress Notes (Signed)
LCSW consulted for patient to return to [place], [place] as he has no other way home and is homeless. Patient also requesting help with medications and care management has met with patient regarding medications. Patient has been offered [employer] bus ticket, but there are no current rides scheduled for today. Tomorrow:03/18/13 will have an available ride at 4:20am.    Patient will be offered a bus ticket for [place] as well as Shelter information if desired.   LCSW working with care management in regards to medication and safe DC plan.   Lane Hacker, MSW Clinical Social Work: Emergency Room [phone]

## 2014-03-17 NOTE — ED Notes (Signed)
Pt walked to waiting room with steady gait. NAd at discharge. REquesting to speak with person to provide ride to [place].

## 2015-03-12 IMAGING — CR DG CHEST 1V PORT
1 series · 1 of 1 positions shown · non-contrast
Comparison: 04/27/2013.

CLINICAL DATA: Cough.

EXAM:
PORTABLE CHEST - 1 VIEW

[AP]
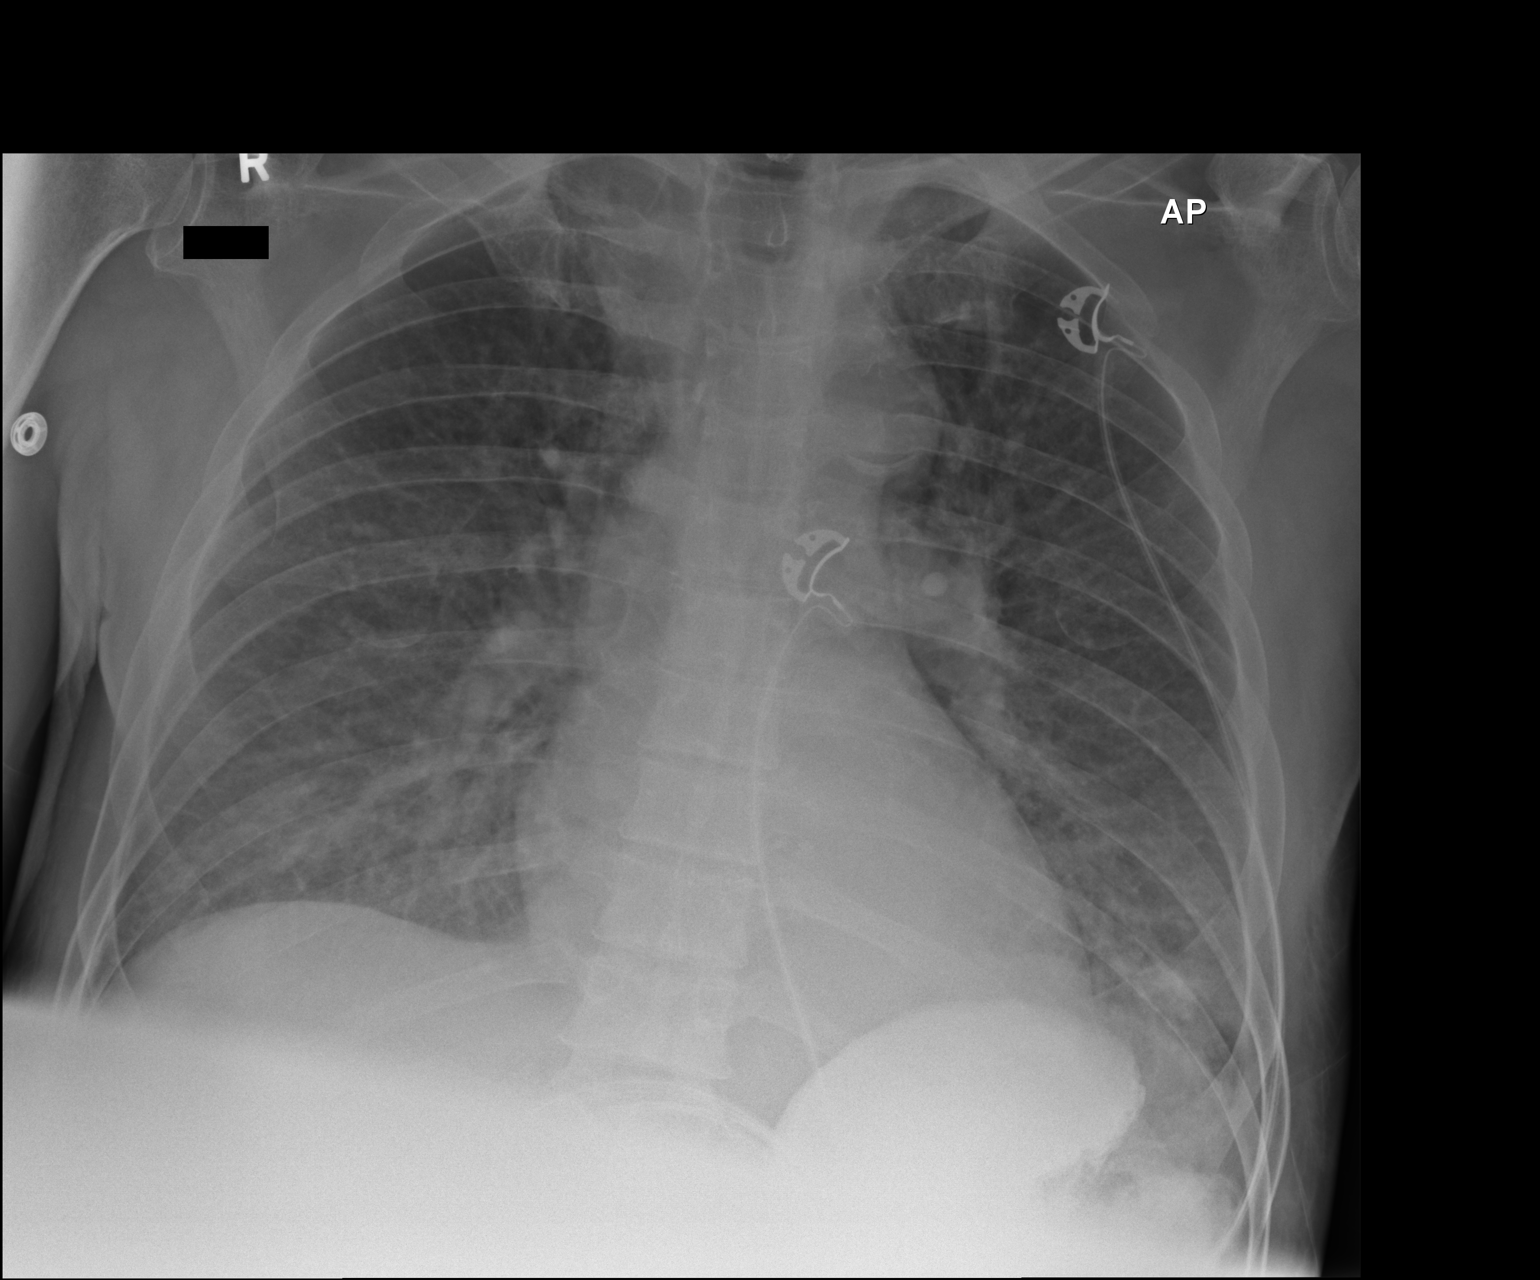

[1 of 1 positions shown; findings below may reference images not displayed]

FINDINGS: The cardiac silhouette remains borderline enlarged. Increased
prominence of the pulmonary vasculature and interstitial markings.
Oral contrast in the stomach and splenic flexure. Unremarkable
bones.
IMPRESSION: Interval mild changes of congestive heart failure.

## 2015-03-12 IMAGING — RF DG UGI W/ GASTROGRAFIN
15 of 24 series · 15 of 24 positions shown · IV contrast (omnipaque)
Comparison: None.

FLUOROSCOPY TIME:  59 seconds.

CLINICAL DATA: Exploratory laparotomy with oversew of gastric AV
malformation and repair of gastrostomy 04/26/2013. Question leak.

EXAM:
WATER SOLUBLE UPPER GI SERIES
TECHNIQUE: Single-column upper GI series was performed using water soluble
contrast.
CONTRAST:  150 ml Omnipaque 300 per mouth.

[Series 1: run · 1 of 1 slices shown (1 of 15)]
[im 1/1]
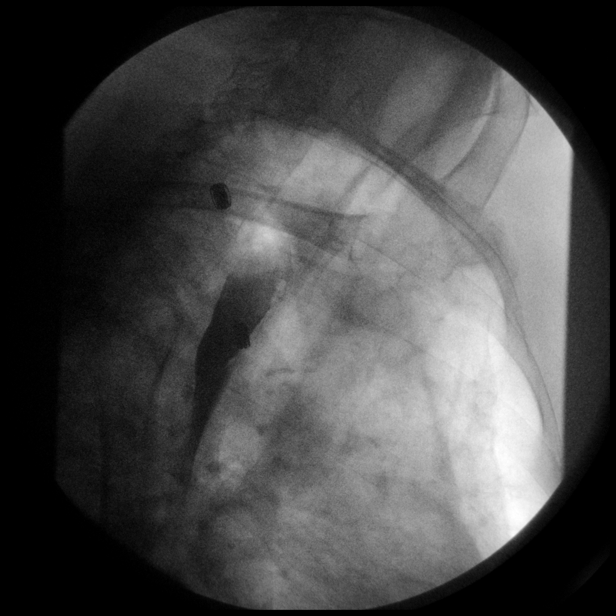

[Series 3: run · 1 of 1 slices shown (2 of 15)]
[im 1/1]
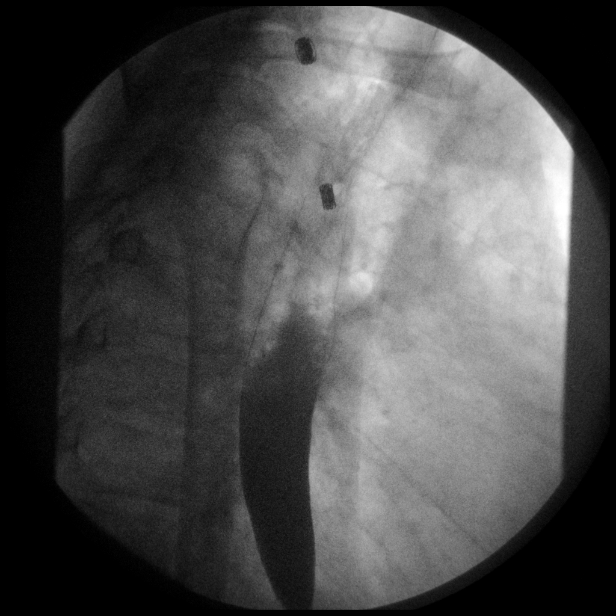

[Series 5: run · 1 of 1 slices shown (3 of 15)]
[im 1/1]
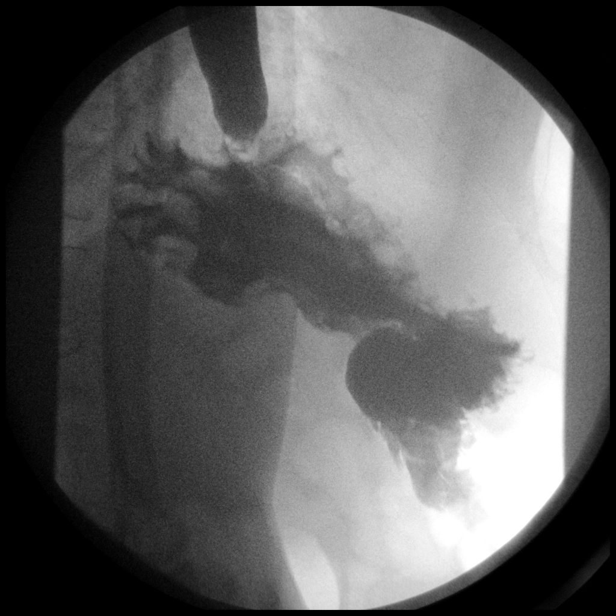

[Series 6: run · 1 of 1 slices shown (4 of 15)]
[im 1/1]
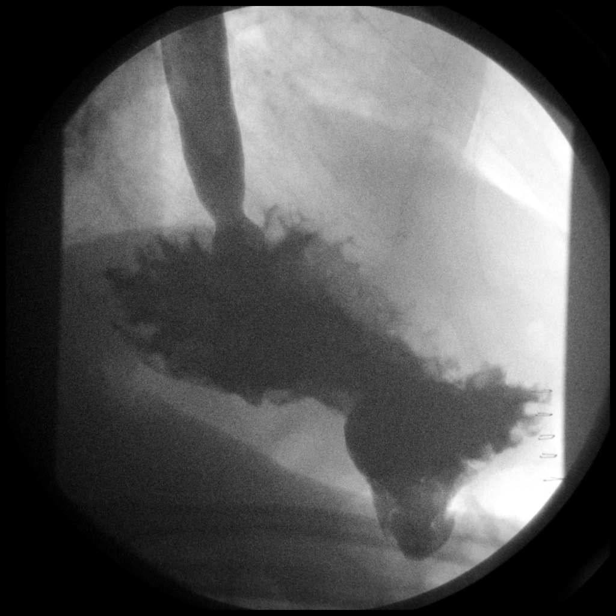

[Series 8: run · 1 of 1 slices shown (5 of 15)]
[im 1/1]
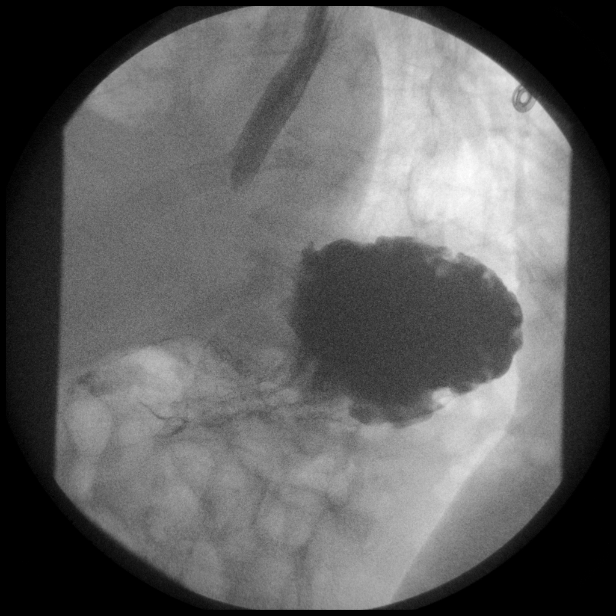

[Series 9: run · 1 of 1 slices shown (6 of 15)]
[im 1/1]
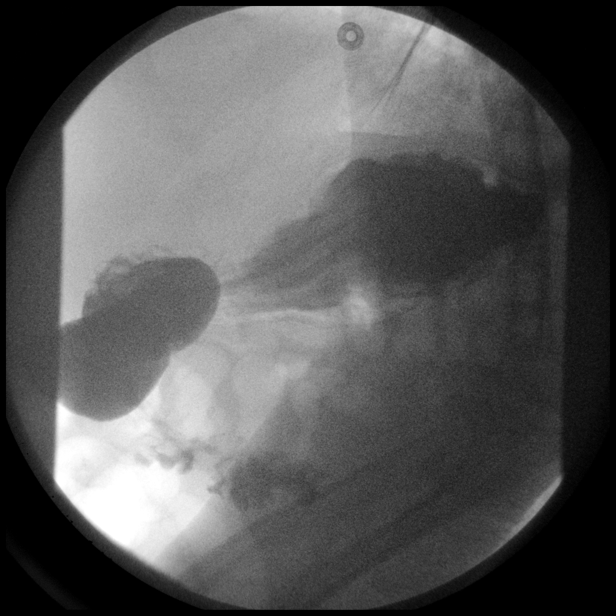

[Series 11: run · 1 of 2 slices shown (7 of 15)]
[im 1/2]
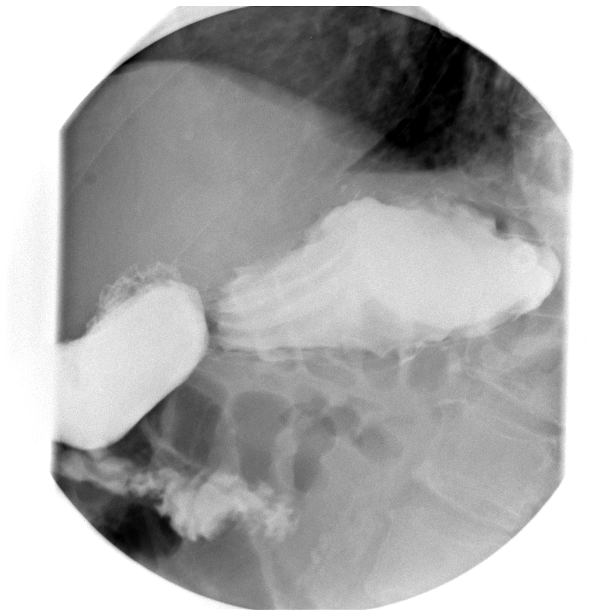

[Series 13: run · 1 of 1 slices shown (8 of 15)]
[im 1/1]
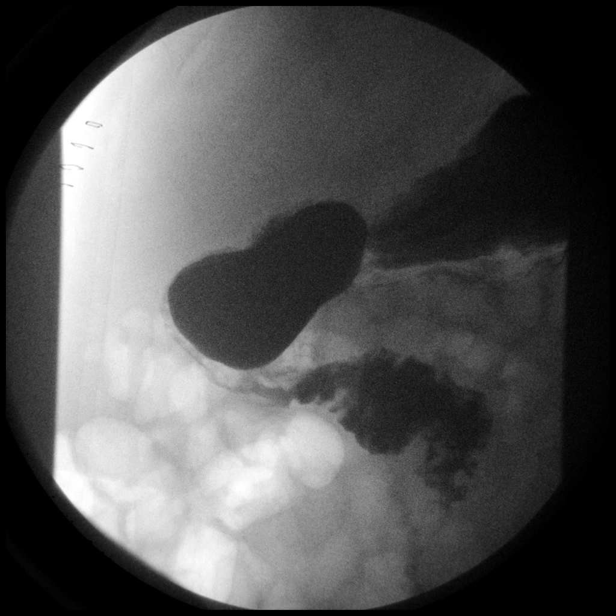

[Series 14: run · 1 of 1 slices shown (9 of 15)]
[im 1/1]
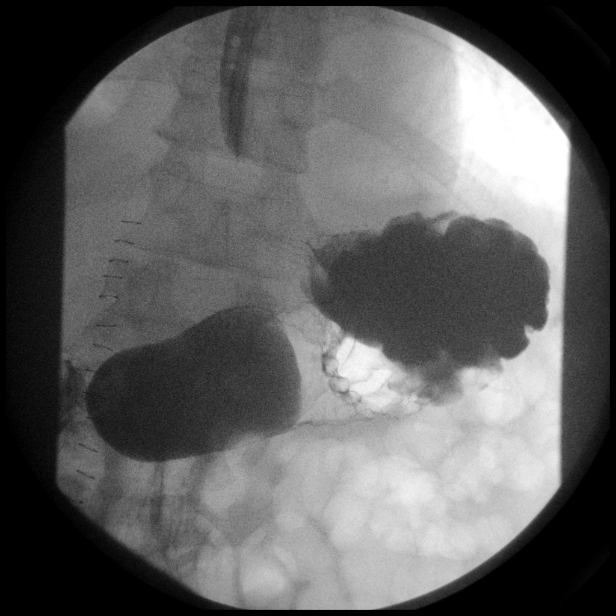

[Series 16: run · 1 of 1 slices shown (10 of 15)]
[im 1/1]
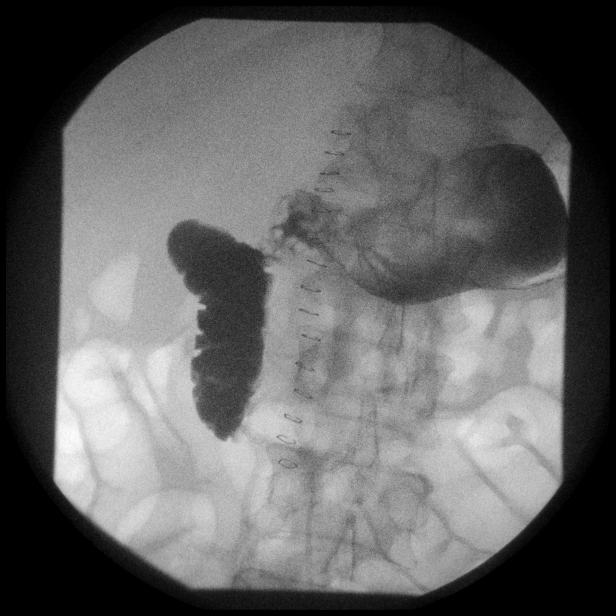

[Series 17: run · 1 of 1 slices shown (11 of 15)]
[im 1/1]
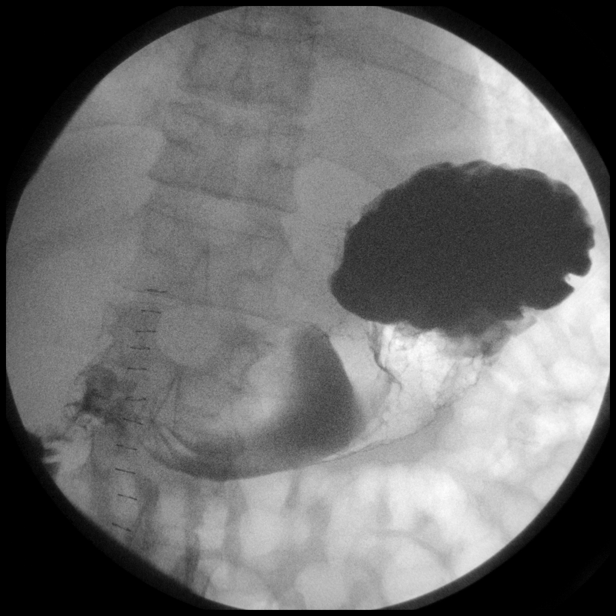

[Series 19: run · 1 of 1 slices shown (12 of 15)]
[im 1/1]
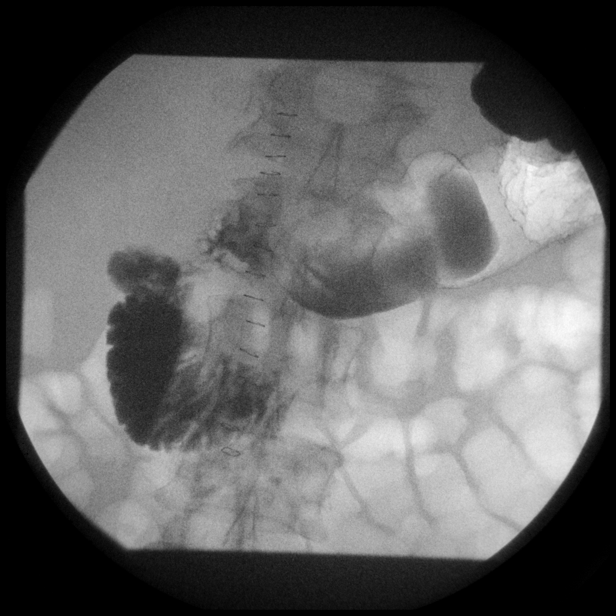

[Series 21: run · 1 of 1 slices shown (13 of 15)]
[im 1/1]
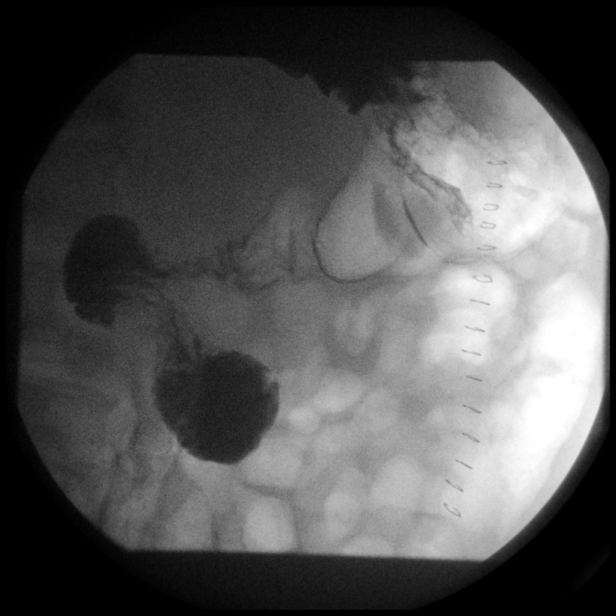

[Series 22: run · 1 of 1 slices shown (14 of 15)]
[im 1/1]
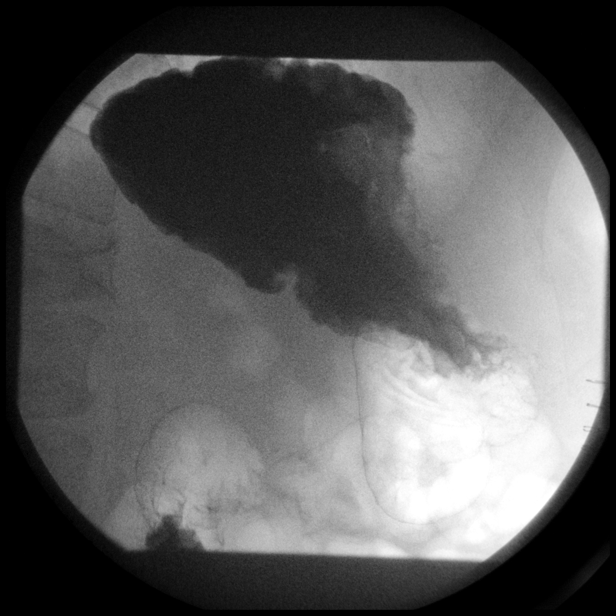

[Series 24: run · 1 of 1 slices shown (15 of 15)]
[im 1/1]
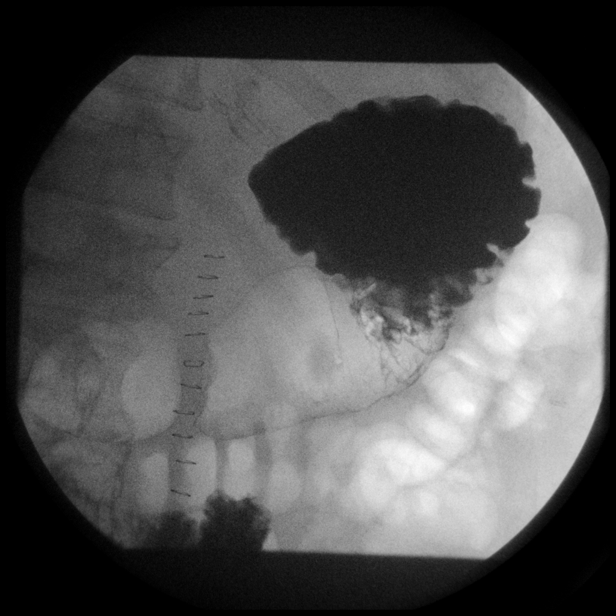

[15 of 24 positions shown; findings below may reference images not displayed]

FINDINGS: The patient swallowed contrast without difficulty. No laryngeal
penetration was observed. The esophageal motility is within normal
limits.

The stomach was incompletely distended, but demonstrates normal
emptying and no gross filling defects. There is some mucosal
thickening. There is no extravasation.
IMPRESSION: No evidence of leak following gastric surgery. The stomach empties
satisfactorily.

## 2016-03-25 ENCOUNTER — Inpatient Hospital Stay: Admit: 2016-03-25 | Discharge: 2016-04-14 | Disposition: E | Payer: Self-pay | Attending: Emergency Medicine

## 2016-03-25 DIAGNOSIS — I469 Cardiac arrest, cause unspecified: Secondary | ICD-10-CM

## 2016-03-25 NOTE — ED Notes (Signed)
Called Medical examiner left message with service to return call.

## 2016-03-25 NOTE — ED Notes (Signed)
1812: Pt arrives in ED via EMS with autopulse, king airway in place, and IO to left leg.   18:13: Cardiac monitor shows asystole  18:14: Dr. Samuella CotaPandya using US  18:15: Autopulse initiated.   18:16: King airway removed by provider. Pt successfully intubated by provider. Equal lung sounds per provider. Pt remains asystole.   18:20: Time of death called by Dr. Samuella CotaPandya.

## 2016-03-25 NOTE — ED Notes (Signed)
Christopher Miller from [facility] office transported pt to morgue.

## 2016-03-25 NOTE — ED Notes (Signed)
Heard back from [facility] office Investigator Foye ClockCarvalho she is calling for transport.

## 2016-03-25 NOTE — ED Triage Notes (Signed)
[facility] brought to patient ER with complaint of "Call went out at 5:7815pm for a 61 year old male who choked. The heimlich maneuver was performed but patient remained unresponsive. CPR was initiated by staff and was in progress when we arrived. Patient received .4mg  Narcan per prison protocol. We placed a king airway and IO to left leg. Patient asystole on arrival." Patient received four doses of epinephrine by EMS. Patient was not shocked." [facility] and [facility]. Pt has autopulse in place and EMS bagging patient. Pt escorted to room 13 and Dr. Samuella CotaPandya at bedside.

## 2016-03-25 NOTE — Other (Signed)
Follow up patient event, concern or complaint.

## 2016-03-25 NOTE — ED Notes (Signed)
Called Radene JourneyGail Landry from risk left message to return a call to me concerning prisoner

## 2016-03-25 NOTE — Progress Notes (Signed)
Chart accessed for chaplin for death certificate information

## 2016-03-25 NOTE — ED Provider Notes (Signed)
HPI Comments: Patient is a 61 year old white male who arrives via EMS from a local prison facility in a state of cardiopulmonary arrest. EMS was contacted by the prison facility at approximately 5:15 PM tonight.  Prior to EMS arrival, patient had a choking episode while eating at the prison facility. He was allegedly eating a peanut butter sandwich per EMS, began choking on it. Also per EMS, a Heimlich maneuver was attempted multiple times unsuccessfully when the patient became cyanotic and experienced a cardiorespiratory arrest. Upon EMS arrival, patient was in asystole and remained in asystole despite EMS efforts were approximately the last 45 minutes. EMS had placed a cane airway to attempt to ventilate the patient as well as an intraosseous line in the left leg for which they administered 4 rounds of epinephrine, also unsuccessfully in terms of being able to regain cardiac activity. CPR was in progress the entire time prior to arrival.  Remainder of patient's history is limited at this time due to extremis.  Unable to obtain review of systems at this time due to patient condition.    The history is provided by the EMS personnel. The history is limited by the condition of the patient.        No past medical history on file.    No past surgical history on file.      No family history on file.    Social History     Social History   ??? Marital status: N/A     Spouse name: N/A   ??? Number of children: N/A   ??? Years of education: N/A     Occupational History   ??? Not on file.     Social History Main Topics   ??? Smoking status: Not on file   ??? Smokeless tobacco: Not on file   ??? Alcohol use Not on file   ??? Drug use: Not on file   ??? Sexual activity: Not on file     Other Topics Concern   ??? Not on file     Social History Narrative         ALLERGIES: Review of patient's allergies indicates not on file.    Review of Systems   Unable to perform ROS: Acuity of condition       There were no vitals filed for this visit.          Physical Exam   Constitutional:   Patient was cyanotic, unresponsive, CPR is in progress via auto pulse. Asystole on monitor.    HENT:   Head: Atraumatic.   King airway is present in the oropharynx. There is copious amount of food products also in the oropharynx.    Cardiovascular:   Heart is asystolic. Peripheral pulses are nonpalpable.   Pulmonary/Chest:     Breath sounds are distant with bagging.  No significant chest rise with bagging.    No spontaneous respirations   Abdominal:   Mildly distended.  Atraumatic.   Neurological:   Patient is unresponsive. Pupils are fixed and dilated. Brainstem reflexes are absent.   Skin:   Cool.  Cyanotic     Vitals reviewed.       MDM  Number of Diagnoses or Management Options  Diagnosis management comments: Assessment and plan: Acute cardiopulmonary arrest secondary to choking episode. While in the emergency department, [facility] airway was removed patient was intubated with an 8.0 ET tube with direct visualization through the cords. Despite reestablishment of an airway, patient remained in asystole. Resuscitation was  halted at 6:20 PM and further medical intervention was deemed futile at this time. Patient was pronounced dead at 6:20 PM.    ED Course       Procedures    6:15pm  Procedure note: Endotracheal intubation. Patient was intubated using a 4.0 Mac blade and 8.0 ET tube. One attempt was made. In the process of intubation, a large food bolus was removed from the patient's oropharynx prior to passage of the endotracheal tube through the cords under direct visualization. Procedure was performed by myself with the assistance of Elmer Picker, NP.  No complications.    6:18pm  Procedure note: Bedside ultrasound. Bedside ultrasound was performed to evaluate cardiac activity. A transthoracic window was obtained which demonstrated cardiac asystole. No pericardial free fluid.

## 2016-03-25 NOTE — ED Notes (Signed)
Informed Christopher Miller of death

## 2016-03-25 NOTE — ED Notes (Signed)
Spoke with Orlinda BlalockJulie Openshaw from [facility]

## 2016-03-25 NOTE — ED Notes (Signed)
Called Nursing supervisor spoke with Lupita LeashDonna informed her that [facility] sending transport for pt.

## 2016-04-14 DEATH — deceased
